# Patient Record
Sex: Female | Born: 1995 | Race: Black or African American | Hispanic: No | Marital: Single | Smoking: Former smoker
Health system: Southern US, Community
[De-identification: ages and names within clinical notes are randomized; demographics above are authoritative.]

## PROBLEM LIST (undated history)

## (undated) DIAGNOSIS — Z789 Other specified health status: Secondary | ICD-10-CM

## (undated) HISTORY — DX: Other specified health status: Z78.9

---
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
## 2019-03-28 ENCOUNTER — Encounter (HOSPITAL_COMMUNITY): Payer: Self-pay

## 2019-06-11 ENCOUNTER — Encounter: Payer: Self-pay | Admitting: *Deleted

## 2019-06-11 ENCOUNTER — Telehealth: Payer: Self-pay | Admitting: Obstetrics & Gynecology

## 2019-06-11 NOTE — Telephone Encounter (Signed)
The patient records were sent from [facility] network. Called the patient to confirm the upcoming appointment and received a fast busy signal. Mailing an appointment reminder letter.

## 2019-06-16 ENCOUNTER — Encounter: Payer: Self-pay | Admitting: *Deleted

## 2019-07-03 ENCOUNTER — Other Ambulatory Visit: Payer: Self-pay

## 2019-07-03 ENCOUNTER — Ambulatory Visit (INDEPENDENT_AMBULATORY_CARE_PROVIDER_SITE_OTHER): Payer: Medicaid Other | Admitting: *Deleted

## 2019-07-03 ENCOUNTER — Encounter: Payer: Self-pay | Admitting: *Deleted

## 2019-07-03 ENCOUNTER — Other Ambulatory Visit: Payer: Self-pay | Admitting: *Deleted

## 2019-07-03 DIAGNOSIS — O0930 Supervision of pregnancy with insufficient antenatal care, unspecified trimester: Secondary | ICD-10-CM | POA: Insufficient documentation

## 2019-07-03 DIAGNOSIS — Z3490 Encounter for supervision of normal pregnancy, unspecified, unspecified trimester: Secondary | ICD-10-CM

## 2019-07-03 MED ORDER — PRENATAL 27-1 MG PO TABS: 1.0000 | ORAL_TABLET | Freq: Every day | ORAL | 12 refills | Status: DC

## 2019-07-03 MED ORDER — COMPLETENATE 29-1 MG PO CHEW: 1.0000 | CHEWABLE_TABLET | Freq: Every day | ORAL | 12 refills | Status: AC

## 2019-07-03 MED ORDER — BLOOD PRESSURE KIT DEVI: 1.0000 | 0 refills | Status: DC | PRN

## 2019-07-03 NOTE — Progress Notes (Signed)
I connected with  Laurie Woodard on 07/03/19 at  9:30 AM EST by telephone and verified that I am speaking with the correct person using two identifiers.   I discussed the limitations, risks, security and privacy concerns of performing an evaluation and management service by telephone and the availability of in person appointments. I also discussed with the patient that there may be a patient responsible charge related to this service. The patient expressed understanding and agreed to proceed. Explained I am completing her New OB Intake today. We discussed Her EDD and that it is based on  sure LMP . I reviewed her allergies, meds, OB History, Medical /Surgical history, and appropriate screenings. I explained I will send her the [employer] app- app sent to her while on phone.  I explained we will send a blood pressure cuff to [facility] that will fill that prescription and they  will call her to verify her information. I asked her to bring the blood pressure cuff with her to her first ob appointment so we can show her how to use it. Explained  then we will have her take her blood pressure weekly and enter into the app. Explained she will have some visits in office and some virtually. She already has Community education officer. Reviewed appointment date/ time with her , our location and to wear mask, no visitors. Explained she will have exam, ob bloodwork, hemoglobin a1C, cbg , genetic testing if desired, pap if needed. I scheduled an [place] at 19 weeks and gave her the appointment. She voices understanding.  Qamar Aughenbaugh,RN 07/03/2019  9:35 AM

## 2019-07-03 NOTE — Patient Instructions (Signed)

## 2019-07-03 NOTE — Telephone Encounter (Signed)
Received fax from [facility] that PNV not covered by Medicaid. Will send in new RX.  Zlata Alcaide,RN

## 2019-07-07 ENCOUNTER — Encounter: Payer: Self-pay | Admitting: Obstetrics and Gynecology

## 2019-07-08 ENCOUNTER — Encounter: Payer: Self-pay | Admitting: Advanced Practice Midwife

## 2019-07-08 ENCOUNTER — Ambulatory Visit (INDEPENDENT_AMBULATORY_CARE_PROVIDER_SITE_OTHER): Payer: Medicaid Other | Admitting: Advanced Practice Midwife

## 2019-07-08 ENCOUNTER — Other Ambulatory Visit (HOSPITAL_COMMUNITY)
Admission: RE | Admit: 2019-07-08 | Discharge: 2019-07-08 | Disposition: A | Discharge status: Home / Self Care | Payer: BLUE CROSS/BLUE SHIELD | Source: Ambulatory Visit | Attending: Obstetrics and Gynecology | Admitting: Obstetrics and Gynecology

## 2019-07-08 ENCOUNTER — Encounter: Payer: Self-pay | Admitting: *Deleted

## 2019-07-08 ENCOUNTER — Other Ambulatory Visit: Payer: Self-pay

## 2019-07-08 VITALS — BP 112/72 | HR 125 | Temp 98.0°F | Wt 153.2 lb

## 2019-07-08 DIAGNOSIS — Z833 Family history of diabetes mellitus: Secondary | ICD-10-CM

## 2019-07-08 DIAGNOSIS — Z3A24 24 weeks gestation of pregnancy: Secondary | ICD-10-CM

## 2019-07-08 DIAGNOSIS — O0932 Supervision of pregnancy with insufficient antenatal care, second trimester: Secondary | ICD-10-CM

## 2019-07-08 DIAGNOSIS — Z1389 Encounter for screening for other disorder: Secondary | ICD-10-CM

## 2019-07-08 DIAGNOSIS — Z3490 Encounter for supervision of normal pregnancy, unspecified, unspecified trimester: Secondary | ICD-10-CM | POA: Insufficient documentation

## 2019-07-08 DIAGNOSIS — O0930 Supervision of pregnancy with insufficient antenatal care, unspecified trimester: Secondary | ICD-10-CM

## 2019-07-08 NOTE — Progress Notes (Signed)
Subjective:   Laurie Woodard is a 23 y.o. G2P0010 at 63w6dby early ultrasound being seen today for her first obstetrical visit.  Her obstetrical history is significant for none. Patient does intend to breast feed. Pregnancy history fully reviewed.  Patient reports no complaints.  HISTORY: OB History  Gravida Para Term Preterm AB Living  2 0 0 0 1 0  SAB TAB Ectopic Multiple Live Births  0 1 0 0 0    # Outcome Date GA Lbr Len/2nd Weight Sex Delivery Anes PTL Lv  2 Current           1 TAB 01/25/18            Last pap smear was done unsure, approx 2 years ago and was unsure   History reviewed. No pertinent past medical history. Past Surgical History:  Procedure Laterality Date  . FINGER SURGERY  2005   Family History  Problem Relation Age of Onset  . Hypertension Mother   . Depression Mother   . Kidney disease Mother   . Diabetes Father    Social History   Tobacco Use  . Smoking status: Former Smoker    Quit date: 11/01/2018    Years since quitting: 0.6  . Smokeless tobacco: Never Used  Substance Use Topics  . Alcohol use: Not Currently    Comment: occasional   . Drug use: Never   No Known Allergies Current Outpatient Medications on File Prior to Visit  Medication Sig Dispense Refill  . Blood Pressure Monitoring (BLOOD PRESSURE KIT) DEVI 1 Device by Does not apply route as needed. 1 each 0  . prenatal vitamin w/FE, FA (NATACHEW) 29-1 MG CHEW chewable tablet Chew 1 tablet by mouth daily at 12 noon. 30 tablet 12  . Prenatal 27-1 MG TABS Take 1 tablet by mouth daily. (Patient not taking: Reported on 07/08/2019) 30 tablet 12  . Prenatal Vit-Fe Fumarate-FA (PRENATAL VITAMINS PO) Take 1 tablet by mouth daily.     No current facility-administered medications on file prior to visit.    Review of Systems Pertinent items noted in HPI and remainder of comprehensive ROS otherwise negative.  Exam   Vitals:   07/08/19 0852  BP: 112/72  Pulse: (!) 125  Temp: 98 F (36.7  C)  Weight: 153 lb 3.2 oz (69.5 kg)   Fetal Heart Rate (bpm): 144 Physical Exam  Constitutional: She is oriented to person, place, and time and well-developed, well-nourished, and in no distress. No distress.  HENT:  Head: Normocephalic.  Cardiovascular: Normal rate.  Pulmonary/Chest: Effort normal.  Abdominal: Soft. There is no abdominal tenderness. There is no rebound.  Genitourinary:    Genitourinary Comments:  External: no lesion Vagina: small amount of white discharge Cervix: pink, smooth, no CMT Uterus: AGA, 24 weeks size uterus     Neurological: She is alert and oriented to person, place, and time.  Skin: Skin is warm and dry.  Psychiatric: Affect normal.  Nursing note and vitals reviewed.   Assessment:   Pregnancy: G2P0010 Patient Active Problem List   Diagnosis Date Noted  . Supervision of low-risk pregnancy 07/03/2019  . Late prenatal care affecting pregnancy 07/03/2019     Plan:  1. Encounter for supervision of low-risk pregnancy, antepartum - routine labs - Panorama/Horizon - Too late for quad/AFP - [place] - BP cuff given   2. Late prenatal care affecting pregnancy, antepartum    Initial labs drawn. Continue prenatal vitamins. Genetic Screening discussed, horizon and NIPS: ordered. Ultrasound  discussed; fetal anatomic survey: undecided. Problem list reviewed and updated. The nature of [place] with multiple MDs and other Advanced Practice Providers was explained to patient; also emphasized that residents, students are part of our team. Routine obstetric precautions reviewed. 50% of 45 min visit spent in counseling and coordination of care. Return in about 4 weeks (around 08/05/2019) for in person visit with GTT and 28 week labs.  Schedule Ob US next available appt.  Marcille Buffy DNP, CNM  07/08/19  10:15 AM

## 2019-07-09 LAB — HEMOGLOBIN A1C
Est. average glucose Bld gHb Est-mCnc: 91 mg/dL
Hgb A1c MFr Bld: 4.8 % (ref 4.8–5.6)

## 2019-07-09 LAB — OBSTETRIC PANEL, INCLUDING HIV
Antibody Screen: NEGATIVE
Basophils Absolute: 0.1 10*3/uL (ref 0.0–0.2)
Basos: 0 %
EOS (ABSOLUTE): 0.8 10*3/uL — ABNORMAL HIGH (ref 0.0–0.4)
Eos: 5 %
HIV Screen 4th Generation wRfx: NONREACTIVE
Hematocrit: 34.1 % (ref 34.0–46.6)
Hemoglobin: 11.7 g/dL (ref 11.1–15.9)
Hepatitis B Surface Ag: NEGATIVE
Immature Grans (Abs): 0.5 10*3/uL — ABNORMAL HIGH (ref 0.0–0.1)
Immature Granulocytes: 4 %
Lymphocytes Absolute: 1.7 10*3/uL (ref 0.7–3.1)
Lymphs: 11 %
MCH: 33 pg (ref 26.6–33.0)
MCHC: 34.3 g/dL (ref 31.5–35.7)
MCV: 96 fL (ref 79–97)
Monocytes Absolute: 1.4 10*3/uL — ABNORMAL HIGH (ref 0.1–0.9)
Monocytes: 10 %
Neutrophils Absolute: 10.4 10*3/uL — ABNORMAL HIGH (ref 1.4–7.0)
Neutrophils: 70 %
Platelets: 189 10*3/uL (ref 150–450)
RBC: 3.55 x10E6/uL — ABNORMAL LOW (ref 3.77–5.28)
RDW: 12.3 % (ref 11.7–15.4)
RPR Ser Ql: NONREACTIVE
Rh Factor: POSITIVE
Rubella Antibodies, IGG: 2.21 index (ref >0.99)
WBC: 14.8 10*3/uL — ABNORMAL HIGH (ref 3.4–10.8)

## 2019-07-09 LAB — GC/CHLAMYDIA PROBE AMP (~~LOC~~) NOT AT ARMC
Chlamydia: NEGATIVE
Comment: NEGATIVE
Comment: NORMAL
Neisseria Gonorrhea: NEGATIVE

## 2019-07-10 LAB — CYTOLOGY - PAP: Diagnosis: NEGATIVE

## 2019-07-10 LAB — URINE CULTURE, OB REFLEX

## 2019-07-10 LAB — CULTURE, OB URINE

## 2019-07-15 ENCOUNTER — Other Ambulatory Visit: Payer: Self-pay

## 2019-07-18 NOTE — L&D Delivery Note (Signed)
Laurie Woodard is a 24 y.o. female G2P0010 with IUP at [redacted]w[redacted]d admitted for SROM.  She progressed without augmentation to complete and pushed 2 times to deliver.  Cord clamping delayed by several minutes then clamped by CNM and cut by FOB.    Delivery Note At 12:28 AM a viable female was delivered in the water via Vaginal, Spontaneous (Presentation: Left Occiput Anterior).  APGAR: 9, 9; weight pending.   Placenta status: Spontaneous, Intact.  Cord: 3 vessels with the following complications: None.  Anesthesia: None Episiotomy: None Lacerations: 1st degree Suture Repair: 3.0 vicryl Est. Blood Loss (mL): 200  Mom to postpartum.  Baby to Couplet care / Skin to Skin.  Rolm Bookbinder CNM 10/13/2019, 12:57 AM

## 2019-07-29 ENCOUNTER — Other Ambulatory Visit: Payer: Self-pay

## 2019-07-29 ENCOUNTER — Ambulatory Visit (HOSPITAL_COMMUNITY)
Admission: RE | Admit: 2019-07-29 | Discharge: 2019-07-29 | Disposition: A | Discharge status: Home / Self Care | Payer: BLUE CROSS/BLUE SHIELD | Source: Ambulatory Visit | Attending: Obstetrics and Gynecology | Admitting: Obstetrics and Gynecology

## 2019-07-29 ENCOUNTER — Other Ambulatory Visit: Payer: Self-pay | Admitting: Advanced Practice Midwife

## 2019-07-29 ENCOUNTER — Other Ambulatory Visit (HOSPITAL_COMMUNITY): Payer: Self-pay | Admitting: *Deleted

## 2019-07-29 DIAGNOSIS — O0932 Supervision of pregnancy with insufficient antenatal care, second trimester: Secondary | ICD-10-CM

## 2019-07-29 DIAGNOSIS — Z3490 Encounter for supervision of normal pregnancy, unspecified, unspecified trimester: Secondary | ICD-10-CM | POA: Diagnosis not present

## 2019-07-29 DIAGNOSIS — Z362 Encounter for other antenatal screening follow-up: Secondary | ICD-10-CM

## 2019-07-29 DIAGNOSIS — Z363 Encounter for antenatal screening for malformations: Secondary | ICD-10-CM | POA: Diagnosis not present

## 2019-07-29 DIAGNOSIS — Z3A27 27 weeks gestation of pregnancy: Secondary | ICD-10-CM

## 2019-07-30 ENCOUNTER — Other Ambulatory Visit: Payer: Self-pay | Admitting: Lactation Services

## 2019-07-30 DIAGNOSIS — Z3490 Encounter for supervision of normal pregnancy, unspecified, unspecified trimester: Secondary | ICD-10-CM

## 2019-08-05 ENCOUNTER — Encounter: Payer: Self-pay | Admitting: Advanced Practice Midwife

## 2019-08-05 ENCOUNTER — Ambulatory Visit (INDEPENDENT_AMBULATORY_CARE_PROVIDER_SITE_OTHER): Payer: BLUE CROSS/BLUE SHIELD | Admitting: Advanced Practice Midwife

## 2019-08-05 ENCOUNTER — Other Ambulatory Visit: Payer: Self-pay

## 2019-08-05 ENCOUNTER — Other Ambulatory Visit: Payer: BLUE CROSS/BLUE SHIELD

## 2019-08-05 VITALS — BP 120/73 | HR 91 | Wt 157.0 lb

## 2019-08-05 DIAGNOSIS — Z3490 Encounter for supervision of normal pregnancy, unspecified, unspecified trimester: Secondary | ICD-10-CM

## 2019-08-05 DIAGNOSIS — Z23 Encounter for immunization: Secondary | ICD-10-CM | POA: Diagnosis not present

## 2019-08-05 DIAGNOSIS — Z3480 Encounter for supervision of other normal pregnancy, unspecified trimester: Secondary | ICD-10-CM

## 2019-08-05 DIAGNOSIS — Z3483 Encounter for supervision of other normal pregnancy, third trimester: Secondary | ICD-10-CM

## 2019-08-05 DIAGNOSIS — Z3A28 28 weeks gestation of pregnancy: Secondary | ICD-10-CM

## 2019-08-05 NOTE — Addendum Note (Signed)
Addended by: Ernestina Patches on: 08/05/2019 09:07 AM   Modules accepted: Orders

## 2019-08-05 NOTE — Progress Notes (Signed)
   PRENATAL VISIT NOTE  Subjective:  Laurie Woodard is a 24 y.o. G2P0010 at [redacted]w[redacted]d being seen today for ongoing prenatal care.  She is currently monitored for the following issues for this low-risk pregnancy and has Supervision of low-risk pregnancy and Late prenatal care affecting pregnancy on their problem list.  Patient reports no complaints.  Contractions: Not present. Vag. Bleeding: None.  Movement: Present. Denies leaking of fluid.   The following portions of the patient's history were reviewed and updated as appropriate: allergies, current medications, past family history, past medical history, past social history, past surgical history and problem list.   Objective:   Vitals:   08/05/19 0835  BP: 120/73  Pulse: 91  Weight: 157 lb (71.2 kg)    Fetal Status: Fetal Heart Rate (bpm): 142 Fundal Height: 28 cm Movement: Present     General:  Alert, oriented and cooperative. Patient is in no acute distress.  Skin: Skin is warm and dry. No rash noted.   Cardiovascular: Normal heart rate noted  Respiratory: Normal respiratory effort, no problems with respiration noted  Abdomen: Soft, gravid, appropriate for gestational age.  Pain/Pressure: Present     Pelvic: Cervical exam deferred        Extremities: Normal range of motion.  Edema: None  Mental Status: Normal mood and affect. Normal behavior. Normal judgment and thought content.   Assessment and Plan:  Pregnancy: G2P0010 at [redacted]w[redacted]d 1. Supervision of other normal pregnancy, antepartum - routine care - GTT and 28 week labs today   Preterm labor symptoms and general obstetric precautions including but not limited to vaginal bleeding, contractions, leaking of fluid and fetal movement were reviewed in detail with the patient. Please refer to After Visit Summary for other counseling recommendations.   Return in about 2 weeks (around 08/19/2019) for virtual visit .  Future Appointments  Date Time Provider Department Center  08/05/2019   8:50 AM WOC-WOCA LAB WOC-WOCA WOC  08/19/2019 10:30 AM WH-MFC NURSE WH-MFC MFC-US  08/19/2019 10:30 AM WH-MFC [place] 5 WH-MFCUS MFC-US    Thressa Sheller DNP, CNM  08/05/19  8:41 AM

## 2019-08-06 ENCOUNTER — Encounter: Payer: Self-pay | Admitting: Advanced Practice Midwife

## 2019-08-06 LAB — CBC
Hematocrit: 34.1 % (ref 34.0–46.6)
Hemoglobin: 11.9 g/dL (ref 11.1–15.9)
MCH: 33 pg (ref 26.6–33.0)
MCHC: 34.9 g/dL (ref 31.5–35.7)
MCV: 95 fL (ref 79–97)
Platelets: 152 10*3/uL (ref 150–450)
RBC: 3.61 x10E6/uL — ABNORMAL LOW (ref 3.77–5.28)
RDW: 11.7 % (ref 11.7–15.4)
WBC: 15.6 10*3/uL — ABNORMAL HIGH (ref 3.4–10.8)

## 2019-08-06 LAB — GLUCOSE TOLERANCE, 2 HOURS W/ 1HR
Glucose, 1 hour: 155 mg/dL (ref 65–179)
Glucose, 2 hour: 116 mg/dL (ref 65–152)
Glucose, Fasting: 68 mg/dL (ref 65–91)

## 2019-08-06 LAB — HIV ANTIBODY (ROUTINE TESTING W REFLEX): HIV Screen 4th Generation wRfx: NONREACTIVE

## 2019-08-06 LAB — RPR: RPR Ser Ql: NONREACTIVE

## 2019-08-19 ENCOUNTER — Other Ambulatory Visit: Payer: Self-pay

## 2019-08-19 ENCOUNTER — Encounter (HOSPITAL_COMMUNITY): Payer: Self-pay

## 2019-08-19 ENCOUNTER — Other Ambulatory Visit (HOSPITAL_COMMUNITY): Payer: Self-pay | Admitting: *Deleted

## 2019-08-19 ENCOUNTER — Ambulatory Visit (HOSPITAL_COMMUNITY): Payer: BLUE CROSS/BLUE SHIELD | Admitting: *Deleted

## 2019-08-19 ENCOUNTER — Telehealth (INDEPENDENT_AMBULATORY_CARE_PROVIDER_SITE_OTHER): Payer: BLUE CROSS/BLUE SHIELD

## 2019-08-19 ENCOUNTER — Ambulatory Visit (HOSPITAL_COMMUNITY)
Admission: RE | Admit: 2019-08-19 | Discharge: 2019-08-19 | Disposition: A | Discharge status: Home / Self Care | Payer: BLUE CROSS/BLUE SHIELD | Source: Ambulatory Visit | Attending: Obstetrics and Gynecology | Admitting: Obstetrics and Gynecology

## 2019-08-19 VITALS — BP 116/72 | HR 73 | Temp 97.7°F

## 2019-08-19 DIAGNOSIS — O0990 Supervision of high risk pregnancy, unspecified, unspecified trimester: Secondary | ICD-10-CM | POA: Insufficient documentation

## 2019-08-19 DIAGNOSIS — O0933 Supervision of pregnancy with insufficient antenatal care, third trimester: Secondary | ICD-10-CM

## 2019-08-19 DIAGNOSIS — Z3A30 30 weeks gestation of pregnancy: Secondary | ICD-10-CM

## 2019-08-19 DIAGNOSIS — O365930 Maternal care for other known or suspected poor fetal growth, third trimester, not applicable or unspecified: Secondary | ICD-10-CM

## 2019-08-19 DIAGNOSIS — Z3490 Encounter for supervision of normal pregnancy, unspecified, unspecified trimester: Secondary | ICD-10-CM

## 2019-08-19 DIAGNOSIS — Z362 Encounter for other antenatal screening follow-up: Secondary | ICD-10-CM | POA: Diagnosis not present

## 2019-08-19 DIAGNOSIS — Z3689 Encounter for other specified antenatal screening: Secondary | ICD-10-CM

## 2019-08-19 NOTE — Patient Instructions (Signed)
Braxton Hicks Contractions °Contractions of the uterus can occur throughout pregnancy, but they are not always a sign that you are in labor. You may have practice contractions called Braxton Hicks contractions. These false labor contractions are sometimes confused with true labor. °What are Braxton Hicks contractions? °Braxton Hicks contractions are tightening movements that occur in the muscles of the uterus before labor. Unlike true labor contractions, these contractions do not result in opening (dilation) and thinning of the cervix. Toward the end of pregnancy (32-34 weeks), Braxton Hicks contractions can happen more often and may become stronger. These contractions are sometimes difficult to tell apart from true labor because they can be very uncomfortable. You should not feel embarrassed if you go to the hospital with false labor. °Sometimes, the only way to tell if you are in true labor is for your health care provider to look for changes in the cervix. The health care provider will do a physical exam and may monitor your contractions. If you are not in true labor, the exam should show that your cervix is not dilating and your water has not broken. °If there are no other health problems associated with your pregnancy, it is completely safe for you to be sent home with false labor. You may continue to have Braxton Hicks contractions until you go into true labor. °How to tell the difference between true labor and false labor °True labor °· Contractions last 30-70 seconds. °· Contractions become very regular. °· Discomfort is usually felt in the top of the uterus, and it spreads to the lower abdomen and low back. °· Contractions do not go away with walking. °· Contractions usually become more intense and increase in frequency. °· The cervix dilates and gets thinner. °False labor °· Contractions are usually shorter and not as strong as true labor contractions. °· Contractions are usually irregular. °· Contractions  are often felt in the front of the lower abdomen and in the groin. °· Contractions may go away when you walk around or change positions while lying down. °· Contractions get weaker and are shorter-lasting as time goes on. °· The cervix usually does not dilate or become thin. °Follow these instructions at home: ° °· Take over-the-counter and prescription medicines only as told by your health care provider. °· Keep up with your usual exercises and follow other instructions from your health care provider. °· Eat and drink lightly if you think you are going into labor. °· If Braxton Hicks contractions are making you uncomfortable: °? Change your position from lying down or resting to walking, or change from walking to resting. °? Sit and rest in a tub of warm water. °? Drink enough fluid to keep your urine pale yellow. Dehydration may cause these contractions. °? Do slow and deep breathing several times an hour. °· Keep all follow-up prenatal visits as told by your health care provider. This is important. °Contact a health care provider if: °· You have a fever. °· You have continuous pain in your abdomen. °Get help right away if: °· Your contractions become stronger, more regular, and closer together. °· You have fluid leaking or gushing from your vagina. °· You pass blood-tinged mucus (bloody show). °· You have bleeding from your vagina. °· You have low back pain that you never had before. °· You feel your baby’s head pushing down and causing pelvic pressure. °· Your baby is not moving inside you as much as it used to. °Summary °· Contractions that occur before labor are   called Braxton Hicks contractions, false labor, or practice contractions. °· Braxton Hicks contractions are usually shorter, weaker, farther apart, and less regular than true labor contractions. True labor contractions usually become progressively stronger and regular, and they become more frequent. °· Manage discomfort from Braxton Hicks contractions  by changing position, resting in a warm bath, drinking plenty of water, or practicing deep breathing. °This information is not intended to replace advice given to you by your health care provider. Make sure you discuss any questions you have with your health care provider. °Document Revised: 06/15/2017 Document Reviewed: 11/16/2016 °Elsevier Patient Education © 2020 [employer] ° °

## 2019-08-19 NOTE — Progress Notes (Signed)
TELEHEALTH OBSTETRICS PRENATAL VIRTUAL VIDEO VISIT ENCOUNTER NOTE  Provider location: Center for [facility] [place]   I connected with Laurie Woodard on 08/19/19 at  9:15 AM EST by MyChart Video Encounter at home and verified that I am speaking with the correct person using two identifiers.   I discussed the limitations, risks, security and privacy concerns of performing an evaluation and management service virtually and the availability of in person appointments. I also discussed with the patient that there may be a patient responsible charge related to this service. The patient expressed understanding and agreed to proceed. Subjective:  Laurie Woodard is a 24 y.o. G2P0010 at [redacted]w[redacted]d being seen today for ongoing prenatal care.  She is currently monitored for the following issues for this low-risk pregnancy and has Supervision of low-risk pregnancy and Late prenatal care affecting pregnancy on their problem list.  Patient reports BH contractions.  Contractions: Not present. Vag. Bleeding: None.  Movement: Present. Denies any leaking of fluid.   Patient questions how she can go about obtaining a new doula who can do delivers at [facility].   The following portions of the patient's history were reviewed and updated as appropriate: allergies, current medications, past family history, past medical history, past social history, past surgical history and problem list.   Objective:   Vitals:   08/19/19 0918  BP: 111/64  Pulse: 76    Fetal Status:     Movement: Present     General:  Alert, oriented and cooperative. Patient is in no acute distress.  Respiratory: Normal respiratory effort, no problems with respiration noted  Mental Status: Normal mood and affect. Normal behavior. Normal judgment and thought content.  Rest of physical exam deferred due to type of encounter  Imaging: [place] MFM OB COMP + 14 WK  Result Date:  07/29/2019 ----------------------------------------------------------------------  OBSTETRICS REPORT                       (Signed Final 07/29/2019 12:40 pm) ---------------------------------------------------------------------- Patient Info  ID #:       [id number]                          D.O.B.:  04/17/1996 (23 yrs)  Name:       Laurie Woodard                    Visit Date: 07/29/2019 09:20 am ---------------------------------------------------------------------- Performed By  Performed By:     Tommie Raymond BS,       Ref. Address:     [address]                    [place], [place]                                                             [address]  Attending:        Noralee Space MD        [facility]  [facility]      [facility] ---------------------------------------------------------------------- Orders   #  Description                          Code         Ordered By   1  US MFM OB COMP + 14 WK               76805.01     HEATHER HOGAN  ----------------------------------------------------------------------   #  Order #                    Accession #                 Episode #   1  [id number]                  [id number]                  [id number]  ---------------------------------------------------------------------- Indications   [redacted] weeks gestation of pregnancy                Z3A.27   Late prenatal care, second trimester           O09.32   Encounter for antenatal screening for          Z36.3   malformations (Low Risk NIPS)  ---------------------------------------------------------------------- Fetal Evaluation  Num Of Fetuses:         1  Fetal Heart Rate(bpm):  138  Cardiac Activity:       Observed  Presentation:           Cephalic  Placenta:               Anterior  P. Cord Insertion:      Visualized, central  Amniotic Fluid  AFI FV:      Within normal limits                              Largest Pocket(cm)                               5.73 ---------------------------------------------------------------------- Biometry  BPD:      68.3  mm     G. Age:  27w 3d         26  %    CI:        79.68   %    70 - 86                                                          FL/HC:      20.1   %    18.8 - 20.6  HC:      241.8  mm     G. Age:  26w 2d          1  %    HC/AC:      1.06        1.05 - 1.21  AC:      228.6  mm     G. Age:  27w 2d         24  %  FL/BPD:     71.2   %    71 - 87  FL:       48.6  mm     G. Age:  26w 2d          5  %    FL/AC:      21.3   %    20 - 24  HUM:      44.5  mm     G. Age:  26w 3d         14  %  CER:      35.8  mm     G. Age:  30w 6d       > 95  %  LV:        3.5  mm  CM:        8.1  mm  Est. FW:     990  gm      2 lb 3 oz     10  % ---------------------------------------------------------------------- OB History  Blood Type:    O+  Gravidity:    2         Term:   0        Prem:   0        SAB:   0  TOP:          1       Ectopic:  0        Living: 0 ---------------------------------------------------------------------- Gestational Age  LMP:           25w 6d        Date:  01/29/19                 EDD:   11/05/19  U/S Today:     26w 6d                                        EDD:   10/29/19  Best:          27w 6d     Det. By:  Marcella DubsEarly Ultrasound         EDD:   10/22/19                                      (03/28/19) ---------------------------------------------------------------------- Anatomy  Cranium:               Appears normal         LVOT:                   Appears normal  Cavum:                 Appears normal         Aortic Arch:            Appears normal  Ventricles:            Appears normal         Ductal Arch:            Appears normal  Choroid Plexus:        Appears normal         Diaphragm:              Appears normal  Cerebellum:  Appears normal         Stomach:                Appears normal, left                                                                        sided  Posterior Fossa:        Appears normal         Abdomen:                Appears normal  Nuchal Fold:           Not applicable (>20    Abdominal Wall:         Appears nml (cord                         wks GA)                                        insert, abd wall)  Face:                  Appears normal         Cord Vessels:           Appears normal (3                         (orbits and profile)                           vessel cord)  Lips:                  Appears normal         Kidneys:                Appear normal  Palate:                Not well visualized    Bladder:                Appears normal  Thoracic:              Appears normal         Spine:                  Limited views                                                                        appear normal  Heart:                 Appears normal         Upper Extremities:      Appears normal                         (  4CH, axis, and                         situs)  RVOT:                  Appears normal         Lower Extremities:      Appears normal  Other:  Parents do not wish to know sex of fetus at this time- Female gender.          Heels and Left 5th digit visualized. Nasal bone visualized. ---------------------------------------------------------------------- Doppler - Fetal Vessels  Umbilical Artery   S/D     %tile     RI              PI                     ADFV    RDFV  2.95       45   0.66             1.02                        No      No ---------------------------------------------------------------------- Cervix Uterus Adnexa  Cervix  Not visualized (advanced GA >24wks)  Uterus  No abnormality visualized.  Left Ovary  Within normal limits. No adnexal mass visualized.  Right Ovary  Within normal limits. No adnexal mass visualized.  Cul De Sac  No free fluid seen.  Adnexa  No abnormality visualized. ---------------------------------------------------------------------- Impression  Late prenatal care.  Her pregnancy is well dated by 10-week  ultrasound.  She does not have any  chronic medical  conditions including diabetes or hypertension.  On cell-free fetal DNA screening, the risks of fetal  aneuploidies are not increased.  We performed fetal anatomy scan. No makers of  aneuploidies or fetal structural defects are seen. Fetal  biometry is consistent with her previously-established dates.  The estimated fetal weight is at the 10th  percentile.  Abdominal circumference measurement is at the 24th  percentile.  Amniotic fluid is normal and good fetal activity is  seen.  Umbilical artery Doppler showed normal forward  diastolic flow.  Patient understands the limitations of  ultrasound in detecting fetal anomalies.  I reassured the patient of the findings that I did not suspect  fetal growth restriction now. ---------------------------------------------------------------------- Recommendations  -An appointment was made for her to return in 3 weeks for  fetal growth assessment. ----------------------------------------------------------------------                  Tama High, MD Electronically Signed Final Report   07/29/2019 12:40 pm ----------------------------------------------------------------------   Assessment and Plan:  Pregnancy: G2P0010 at [redacted]w[redacted]d 1. Encounter for supervision of low-risk pregnancy, antepartum -Reviewed BH contractions and normalcy during 3rd trimester.  Educated on difference between [facility] contractions and signs of PTL. -Discussed usage of Doula services during labor.  Informed that doula supervisor would be contacted regarding further information. -Reviewed previous ultrasound and patient scheduled for f/u today. -Anticipatory guidance for upcoming appts.  Preterm labor symptoms and general obstetric precautions including but not limited to vaginal bleeding, contractions, leaking of fluid and fetal movement were reviewed in detail with the patient. I discussed the assessment and treatment plan with the patient. The patient was provided an opportunity to ask  questions and all were answered. The patient agreed with the plan and demonstrated an understanding of the  instructions. The patient was advised to call back or seek an in-person office evaluation/go to MAU at [facility] for any urgent or concerning symptoms. Please refer to After Visit Summary for other counseling recommendations.   I provided 7 minutes of face-to-face time during this encounter.  No follow-ups on file.  Future Appointments  Date Time Provider Department Center  08/19/2019 10:30 AM WH-MFC NURSE WH-MFC MFC-US  08/19/2019 10:30 AM WH-MFC [place] 5 WH-MFCUS MFC-US  09/02/2019  9:35 AM Sharyon Cable, CNM WOC-WOCA WOC  09/16/2019  9:35 AM Magnus Sinning [place], PA-C WOC-WOCA WOC  09/23/2019  9:35 AM Marvetta Gibbons, Brand Males, NP WOC-WOCA WOC  09/30/2019  9:35 AM Nugent, Odie Sera, NP WOC-WOCA WOC  10/07/2019  9:35 AM Armando Reichert, CNM WOC-WOCA WOC    Cherre Robins, CNM Center for [employer], [facility]

## 2019-08-19 NOTE — Progress Notes (Signed)
Called pt at 0901. VM left stating I was calling to check pt in for virtual appt and will call again at 0915.  I connected with  [name] on 08/19/19 at 0917 by MyChart and verified that I am speaking with the correct person using two identifiers.   I discussed the limitations, risks, security and privacy concerns of performing an evaluation and management service by telephone and the availability of in person appointments. I also discussed with the patient that there may be a patient responsible charge related to this service. The patient expressed understanding and agreed to proceed.  Marjo Bicker, RN 08/19/2019  9:17 AM

## 2019-09-02 ENCOUNTER — Telehealth (INDEPENDENT_AMBULATORY_CARE_PROVIDER_SITE_OTHER): Payer: BC Managed Care – PPO | Admitting: Certified Nurse Midwife

## 2019-09-02 ENCOUNTER — Encounter: Payer: Self-pay | Admitting: Certified Nurse Midwife

## 2019-09-02 DIAGNOSIS — Z3A32 32 weeks gestation of pregnancy: Secondary | ICD-10-CM

## 2019-09-02 DIAGNOSIS — Z3493 Encounter for supervision of normal pregnancy, unspecified, third trimester: Secondary | ICD-10-CM

## 2019-09-02 DIAGNOSIS — O0933 Supervision of pregnancy with insufficient antenatal care, third trimester: Secondary | ICD-10-CM

## 2019-09-02 NOTE — Progress Notes (Signed)
TELEHEALTH OBSTETRICS PRENATAL VIRTUAL VIDEO VISIT ENCOUNTER NOTE  Provider location: Center for [facility] [place]   I connected with Laurie Woodard on 09/02/19 at  9:35 AM EST by MyChart Video Encounter at home and verified that I am speaking with the correct person using two identifiers.   I discussed the limitations, risks, security and privacy concerns of performing an evaluation and management service virtually and the availability of in person appointments. I also discussed with the patient that there may be a patient responsible charge related to this service. The patient expressed understanding and agreed to proceed. Subjective:  Laurie Woodard is a 24 y.o. G2P0010 at [redacted]w[redacted]d being seen today for ongoing prenatal care.  She is currently monitored for the following issues for this low-risk pregnancy and has Supervision of low-risk pregnancy and Late prenatal care affecting pregnancy on their problem list.  Patient reports no complaints.  Contractions: Not present. Vag. Bleeding: None.  Movement: Present. Denies any leaking of fluid.   The following portions of the patient's history were reviewed and updated as appropriate: allergies, current medications, past family history, past medical history, past social history, past surgical history and problem list.   Objective:  There were no vitals filed for this visit.  Fetal Status:     Movement: Present     General:  Alert, oriented and cooperative. Patient is in no acute distress.  Respiratory: Normal respiratory effort, no problems with respiration noted  Mental Status: Normal mood and affect. Normal behavior. Normal judgment and thought content.  Rest of physical exam deferred due to type of encounter  Imaging: [place] MFM OB FOLLOW UP  Result Date: 08/19/2019 ----------------------------------------------------------------------  OBSTETRICS REPORT                       (Signed Final 08/19/2019 12:13 pm)  ---------------------------------------------------------------------- Patient Info  ID #:       [id number]                          D.O.B.:  1995/11/12 (24 yrs)  Name:       Laurie Woodard                    Visit Date: 08/19/2019 10:15 am ---------------------------------------------------------------------- Performed By  Performed By:     Hurman Horn          Ref. Address:     [address]                    [place]                                                             [address]  Attending:        Ma Rings MD         [facility]  [facility] By:      [facility] ---------------------------------------------------------------------- Orders   #  Description                          Code         Ordered By   1  US MFM OB FOLLOW UP                  [id number]     Noralee SpaceAVI SHANKAR  ----------------------------------------------------------------------   #  Order #                    Accession #                 Episode #   1  [id number]                  [id number]  ---------------------------------------------------------------------- Indications   Maternal care for known or suspected poor      O36.5930   fetal growth, third trimester, not applicable or   unspecified IUGR   [redacted] weeks gestation of pregnancy                Z3A.30   Late to prenatal care, third trimester         O09.33   Encounter for other antenatal screening        Z36.2   follow-up (low risk Nips, neg horizon)  ---------------------------------------------------------------------- Fetal Evaluation  Num Of Fetuses:         1  Fetal Heart Rate(bpm):  126  Cardiac Activity:       Observed  Presentation:           Cephalic  Placenta:               Anterior  P. Cord Insertion:      Visualized  Amniotic Fluid  AFI FV:      Within normal limits  AFI Sum(cm)     %Tile       Largest Pocket(cm)  14.99           53          4.54  RUQ(cm)       RLQ(cm)        LUQ(cm)        LLQ(cm)  4.54          4.52          3.43           2.5 ---------------------------------------------------------------------- Biometry  BPD:      74.2  mm     G. Age:  29w 5d         12  %    CI:         73.5   %    70 - 86                                                          FL/HC:      19.9   %    19.3 - 21.3  HC:       275   mm     G. Age:  30w 0d          5  %    HC/AC:      1.05        0.96 - 1.17  AC:      261.8  mm     G. Age:  30w 2d         30  %    FL/BPD:     73.6   %    71 - 87  FL:       54.6  mm     G. Age:  28w 6d          3  %    FL/AC:      20.9   %    20 - 24  HUM:      50.4  mm     G. Age:  29w 4d         24  %  Est. FW:    1453  gm      3 lb 3 oz     11  % ---------------------------------------------------------------------- OB History  Blood Type:    O+  Gravidity:    2         Term:   0        Prem:   0        SAB:   0  TOP:          1       Ectopic:  0        Living: 0 ---------------------------------------------------------------------- Gestational Age  LMP:           28w 6d        Date:  01/29/19                 EDD:   11/05/19  U/S Today:     29w 5d                                        EDD:   10/30/19  Best:          30w 6d     Det. By:  Marcella Dubs         EDD:   10/22/19                                      (03/28/19) ---------------------------------------------------------------------- Anatomy  Cranium:               Appears normal         LVOT:                   Appears normal  Cavum:                 Appears normal         Aortic Arch:            Appears normal  Ventricles:            Appears normal         Ductal Arch:            Appears normal  Choroid Plexus:        Previously seen        Diaphragm:  Appears normal  Cerebellum:            Previously seen        Stomach:                Appears normal, left                                                                        sided  Posterior Fossa:       Previously seen        Abdomen:                 Appears normal  Nuchal Fold:           Not applicable (>78    Abdominal Wall:         Previously seen                         wks GA)  Face:                  Orbits and profile     Cord Vessels:           Appears normal (3                         previously seen                                vessel cord)  Lips:                  Appears normal         Kidneys:                Appear normal  Palate:                Not well visualized    Bladder:                Appears normal  Thoracic:              Appears normal         Spine:                  Limited views                                                                        previously seen  Heart:                 Appears normal         Upper Extremities:      Previously seen                         (4CH, axis, and  situs)  RVOT:                  Appears normal         Lower Extremities:      Previously seen  Other:  Parents do not wish to know sex of fetus at this time- Female gender.          Heels and Left 5th digit prev visualized. Nasal bone visualized. ---------------------------------------------------------------------- Comments  This patient was seen for a follow up growth scan as the  overall fetal growth measured in the lower normal range  during her prior ultrasound exam.  The patient denies any  problems since her last exam and reports feeling fetal  movements throughout the day.  The overall EFW obtained today continues to measure in the  lower normal range (11th percentile).  There was normal  amniotic fluid noted.  Doppler studies of the umbilical arteries  performed today continues to show normal forward flow.  There were no signs of absent or reversed end-diastolic flow.  The patient was advised that we will continue to follow her  closely to assess the fetal growth.  A follow-up growth scan  was scheduled in 3 weeks.  She understands that should fetal growth restriction be noted  later in her pregnancy, that an  earlier delivery usually at  around 37 to 38 weeks will be recommended. ----------------------------------------------------------------------                   Ma Rings, MD Electronically Signed Final Report   08/19/2019 12:13 pm ----------------------------------------------------------------------   Assessment and Plan:  Pregnancy: G2P0010 at [redacted]w[redacted]d 1. Encounter for supervision of low-risk pregnancy in third trimester - Patient doing well, no complaints - Routine prenatal care  - Anticipatory guidance on upcoming appointments with in person visit being scheduled for 36 weeks for GBS swabs  - Educated and discussed birth plan/goals with patient, patient is planning on having doula- discussed with patient that during this time all doulas must go through screening process prior to arrival. Gave patient website for doula to fill out form ([email or web address]  - Patient is interested in waterbirth, discussed with patient that waterbirth at this time is scheduled to return the beginning of March. Discussed with patient the requirement of completion of waterbirth class and negative COVID test prior to being able to have waterbirth - patient verbalizes understanding.  - Follow up fetal growth [place] scheduled for 2/23, patient currently measuring in 11%tile for EFW   2. Late prenatal care affecting pregnancy in third trimester - Care initiated at 24 weeks    Preterm labor symptoms and general obstetric precautions including but not limited to vaginal bleeding, contractions, leaking of fluid and fetal movement were reviewed in detail with the patient. I discussed the assessment and treatment plan with the patient. The patient was provided an opportunity to ask questions and all were answered. The patient agreed with the plan and demonstrated an understanding of the instructions. The patient was advised to call back or seek an in-person office evaluation/go to MAU at [facility] for  any urgent or concerning symptoms. Please refer to After Visit Summary for other counseling recommendations.   I provided 10 minutes of face-to-face time during this encounter.  Return in about 3 weeks (around 09/23/2019) for ROB/GBS.  Future Appointments  Date Time Provider Department Center  09/09/2019 10:30 AM WH-MFC NURSE WH-MFC MFC-US  09/09/2019 10:30 AM WH-MFC [place] 1 WH-MFCUS MFC-US  09/16/2019  9:35  AM Kathlene Cote [facility] WOC  09/23/2019  9:35 AM Marvetta Gibbons, Brand Males, NP WOC-WOCA WOC  09/30/2019  9:35 AM Loralee Pacas, Odie Sera, NP WOC-WOCA WOC  10/07/2019  9:35 AM Armando Reichert, CNM WOC-WOCA WOC    Sharyon Cable, CNM Center for [employer], [facility]

## 2019-09-02 NOTE — Progress Notes (Signed)
I connected with  Laurie Woodard on 09/02/19 at  9:35 AM EST by telephone and verified that I am speaking with the correct person using two identifiers.   I discussed the limitations, risks, security and privacy concerns of performing an evaluation and management service by telephone and the availability of in person appointments. I also discussed with the patient that there may be a patient responsible charge related to this service. The patient expressed understanding and agreed to proceed.  Henrietta Dine, CMA 09/02/2019  9:28 AM

## 2019-09-04 ENCOUNTER — Encounter: Payer: Self-pay | Admitting: Advanced Practice Midwife

## 2019-09-09 ENCOUNTER — Ambulatory Visit (HOSPITAL_COMMUNITY): Payer: BC Managed Care – PPO | Admitting: *Deleted

## 2019-09-09 ENCOUNTER — Ambulatory Visit (HOSPITAL_COMMUNITY)
Admission: RE | Admit: 2019-09-09 | Discharge: 2019-09-09 | Disposition: A | Discharge status: Home / Self Care | Payer: BC Managed Care – PPO | Source: Ambulatory Visit | Attending: Obstetrics | Admitting: Obstetrics

## 2019-09-09 ENCOUNTER — Other Ambulatory Visit: Payer: Self-pay

## 2019-09-09 ENCOUNTER — Other Ambulatory Visit (HOSPITAL_COMMUNITY): Payer: Self-pay | Admitting: *Deleted

## 2019-09-09 ENCOUNTER — Encounter (HOSPITAL_COMMUNITY): Payer: Self-pay

## 2019-09-09 DIAGNOSIS — O0933 Supervision of pregnancy with insufficient antenatal care, third trimester: Secondary | ICD-10-CM | POA: Insufficient documentation

## 2019-09-09 DIAGNOSIS — Z362 Encounter for other antenatal screening follow-up: Secondary | ICD-10-CM

## 2019-09-09 DIAGNOSIS — Z3A33 33 weeks gestation of pregnancy: Secondary | ICD-10-CM | POA: Diagnosis not present

## 2019-09-09 DIAGNOSIS — Z3689 Encounter for other specified antenatal screening: Secondary | ICD-10-CM | POA: Diagnosis not present

## 2019-09-09 DIAGNOSIS — IMO0002 Reserved for concepts with insufficient information to code with codable children: Secondary | ICD-10-CM

## 2019-09-16 ENCOUNTER — Encounter: Payer: Self-pay | Admitting: Medical

## 2019-09-16 ENCOUNTER — Other Ambulatory Visit: Payer: Self-pay

## 2019-09-16 ENCOUNTER — Ambulatory Visit (INDEPENDENT_AMBULATORY_CARE_PROVIDER_SITE_OTHER): Payer: BC Managed Care – PPO | Admitting: Medical

## 2019-09-16 VITALS — BP 123/68 | HR 85 | Wt 166.4 lb

## 2019-09-16 DIAGNOSIS — O0933 Supervision of pregnancy with insufficient antenatal care, third trimester: Secondary | ICD-10-CM

## 2019-09-16 DIAGNOSIS — Z3A34 34 weeks gestation of pregnancy: Secondary | ICD-10-CM

## 2019-09-16 DIAGNOSIS — Z3493 Encounter for supervision of normal pregnancy, unspecified, third trimester: Secondary | ICD-10-CM

## 2019-09-16 NOTE — Patient Instructions (Addendum)
Fetal Movement Counts Patient Name: ________________________________________________ Patient Due Date: ____________________ What is a fetal movement count?  A fetal movement count is the number of times that you feel your baby move during a certain amount of time. This may also be called a fetal kick count. A fetal movement count is recommended for every pregnant woman. You may be asked to start counting fetal movements as early as week 28 of your pregnancy. Pay attention to when your baby is most active. You may notice your baby's sleep and wake cycles. You may also notice things that make your baby move more. You should do a fetal movement count:  When your baby is normally most active.  At the same time each day. A good time to count movements is while you are resting, after having something to eat and drink. How do I count fetal movements? 1. Find a quiet, comfortable area. Sit, or lie down on your side. 2. Write down the date, the start time and stop time, and the number of movements that you felt between those two times. Take this information with you to your health care visits. 3. Write down your start time when you feel the first movement. 4. Count kicks, flutters, swishes, rolls, and jabs. You should feel at least 10 movements. 5. You may stop counting after you have felt 10 movements, or if you have been counting for 2 hours. Write down the stop time. 6. If you do not feel 10 movements in 2 hours, contact your health care provider for further instructions. Your health care provider may want to do additional tests to assess your baby's well-being. Contact a health care provider if:  You feel fewer than 10 movements in 2 hours.  Your baby is not moving like he or she usually does. Date: ____________ Start time: ____________ Stop time: ____________ Movements: ____________ Date: ____________ Start time: ____________ Stop time: ____________ Movements: ____________ Date: ____________  Start time: ____________ Stop time: ____________ Movements: ____________ Date: ____________ Start time: ____________ Stop time: ____________ Movements: ____________ Date: ____________ Start time: ____________ Stop time: ____________ Movements: ____________ Date: ____________ Start time: ____________ Stop time: ____________ Movements: ____________ Date: ____________ Start time: ____________ Stop time: ____________ Movements: ____________ Date: ____________ Start time: ____________ Stop time: ____________ Movements: ____________ Date: ____________ Start time: ____________ Stop time: ____________ Movements: ____________ This information is not intended to replace advice given to you by your health care provider. Make sure you discuss any questions you have with your health care provider. Document Revised: 02/20/2019 Document Reviewed: 02/20/2019 Elsevier Patient Education  2020 Elsevier Inc. Braxton Hicks Contractions Contractions of the uterus can occur throughout pregnancy, but they are not always a sign that you are in labor. You may have practice contractions called Braxton Hicks contractions. These false labor contractions are sometimes confused with true labor. What are Braxton Hicks contractions? Braxton Hicks contractions are tightening movements that occur in the muscles of the uterus before labor. Unlike true labor contractions, these contractions do not result in opening (dilation) and thinning of the cervix. Toward the end of pregnancy (32-34 weeks), Braxton Hicks contractions can happen more often and may become stronger. These contractions are sometimes difficult to tell apart from true labor because they can be very uncomfortable. You should not feel embarrassed if you go to the hospital with false labor. Sometimes, the only way to tell if you are in true labor is for your health care provider to look for changes in the cervix. The health care provider   will do a physical exam and may  monitor your contractions. If you are not in true labor, the exam should show that your cervix is not dilating and your water has not broken. °If there are no other health problems associated with your pregnancy, it is completely safe for you to be sent home with false labor. You may continue to have Braxton Hicks contractions until you go into true labor. °How to tell the difference between true labor and false labor °True labor °· Contractions last 30-70 seconds. °· Contractions become very regular. °· Discomfort is usually felt in the top of the uterus, and it spreads to the lower abdomen and low back. °· Contractions do not go away with walking. °· Contractions usually become more intense and increase in frequency. °· The cervix dilates and gets thinner. °False labor °· Contractions are usually shorter and not as strong as true labor contractions. °· Contractions are usually irregular. °· Contractions are often felt in the front of the lower abdomen and in the groin. °· Contractions may go away when you walk around or change positions while lying down. °· Contractions get weaker and are shorter-lasting as time goes on. °· The cervix usually does not dilate or become thin. °Follow these instructions at home: ° °· Take over-the-counter and prescription medicines only as told by your health care provider. °· Keep up with your usual exercises and follow other instructions from your health care provider. °· Eat and drink lightly if you think you are going into labor. °· If Braxton Hicks contractions are making you uncomfortable: °? Change your position from lying down or resting to walking, or change from walking to resting. °? Sit and rest in a tub of warm water. °? Drink enough fluid to keep your urine pale yellow. Dehydration may cause these contractions. °? Do slow and deep breathing several times an hour. °· Keep all follow-up prenatal visits as told by your health care provider. This is important. °Contact a  health care provider if: °· You have a fever. °· You have continuous pain in your abdomen. °Get help right away if: °· Your contractions become stronger, more regular, and closer together. °· You have fluid leaking or gushing from your vagina. °· You pass blood-tinged mucus (bloody show). °· You have bleeding from your vagina. °· You have low back pain that you never had before. °· You feel your baby’s head pushing down and causing pelvic pressure. °· Your baby is not moving inside you as much as it used to. °Summary °· Contractions that occur before labor are called Braxton Hicks contractions, false labor, or practice contractions. °· Braxton Hicks contractions are usually shorter, weaker, farther apart, and less regular than true labor contractions. True labor contractions usually become progressively stronger and regular, and they become more frequent. °· Manage discomfort from Braxton Hicks contractions by changing position, resting in a warm bath, drinking plenty of water, or practicing deep breathing. °This information is not intended to replace advice given to you by your health care provider. Make sure you discuss any questions you have with your health care provider. °Document Revised: 06/15/2017 Document Reviewed: 11/16/2016 °Elsevier Patient Education © 2020 Elsevier Inc. ° °AREA PEDIATRIC/FAMILY PRACTICE PHYSICIANS ° °[place] ([address]) °• [facility] °o Chambliss, MD; Eniola, MD; Hale, MD; Hensel, MD; McDiarmid, MD; McIntyer, MD; Neal, MD; Walden, MD °o [address] [place], [place] [address] °o [phone] °o Mon-Fri 8:30-12:30, 1:30-5:00 °o Providers come to see babies   at [facility] °o Accepting Medicaid °• [facility] °o Limited providers who accept newborns: Koirala, MD; Morrow, MD; Wolters, MD °o [address], [place], [place] [address] °o [phone] °o Mon-Fri 8:00-5:30 °o Babies seen by providers at  [facility] °o Does NOT accept Medicaid °o Please call early in hospitalization for appointment (limited availability)  °• [facility] °o Mulberry, MD °o [address] [place], [place] [address] °o [phone] °o Mon, Tue, Thur, Fri 8:30-5:00, Wed 10:00-7:00 (closed 1-2pm) °o Babies seen by [facility] providers °o Accepting Medicaid °• [facility] Pediatric[facility] °o Rubin, MD °o [address], [place], [place] [address] °o [phone] °o Mon-Fri 8:30-5:00, Sat 8:30-12:00 °o Provider comes to see babies at [facility] °o Accepting Medicaid °o Must have been referred from current patients or contacted office prior to delivery °• [facility] ([facility]) °o Brown, MD; Chandler, MD; Ettefagh, MD; Grant, MD; Lester, MD; McCormick, MD; McQueen, MD; Prose, MD; Simha, MD; Stanley, MD; Stryffeler, NP; Tebben, NP °o [address], [place], [place] [address] °o [phone] °o Mon, Tue, Thur, Fri 8:30-5:30, Wed 9:30-5:30, Sat 8:30-12:30 °o Babies seen by [facility] providers °o Accepting Medicaid °o Only accepting infants of first-time parents or siblings of current patients °o Hospital discharge coordinator will make follow-up appointment °• Jack Amos °o [address], [place], [place]  [address] °o [phone]   Fax - [phone] °• [facility] °o [address], [place], [place]  [address] °o Phone - [phone]   Fax - [phone] °• Shilpa Gosrani °o [address], [place], [place]  [address] °o [phone] ° °[place] ([address]) °• [facility] °o Bates, MD; [place], MD; Cooper, Cox, MD; MD; Davis, MD; Dovico, MD; Ettefaugh, MD; Little, MD; Lowe, MD; Keiffer, MD; Melvin, MD; Sumner, MD; Williams, MD °o [address], [place], [place] [address] °o [phone] °o Mon-Fri 8:30-5:00 (extended evenings Mon-Thur as needed), Sat-Sun  10:00-1:00 °o Providers come to see babies at [facility] °o Accepting Medicaid for families of first-time babies and families with all children in the household age 3 and under. Must register with office prior to making appointment (M-F only). °• [facility] °o Henson, NP; Knapp, MD; Lalonde, MD; Tysinger, PA °o [address] [place], [place] [address] °o [phone] °o Mon-Fri 8:00-5:00 °o Babies seen by providers at [facility] °o Does NOT accept Medicaid/Commercial Insurance Only °• [facility] ([facility]  °o Artis, MD; Barnes, MD; Bratton, MD; Coccaro, MD; Lockett Gardner, MD; Kramer, MD; Marshall, MD; Netherton, MD; Poleto, MD; Skinner, MD °o [address] [place], [place] [address] °o [phone] °o Mon-Fri 8:30-5:30, Sat (Oct.-Mar.) 9:00-1:00 °o Babies seen by providers at [facility] °o Accepting Medicaid ° °[place] ([address]) °• [facility] °o Reid, MD; Warner, MD °o [address], [place], [place] [address] °o [phone] °o Mon-Fri 8:30-5:00, Sat 8:30-12:00 °o Providers come to see babies at [facility] °o Does NOT accept Medicaid °• [facility] °o Becker, PA; Hagler, MD; Scifres, PA; Sun, MD; Swayne, MD °o [address], [place], [place] [address] °o [phone] °o Mon-Fri 8:00-5:00 °o Babies seen by providers at [facility] °o Does NOT accept Medicaid °o Only accepting babies of parents who are patients °o Please call   early in hospitalization for appointment (limited availability) °• [facility] °o Clark, MD; Frye, MD; Kelleher, MD; Mack, NP; Miller, MD; O'Keller, MD; Patterson, NP; Pudlo, MD; Puzio, MD; Thomas, MD; Tucker, MD; Twiselton, MD °o [address], [place], [place] [address] °o [phone] °o Mon-Fri 8:00-5:00, Sat 9:00-12:00 °o Providers come to see babies at [facility] °o Does NOT accept  Medicaid ° °[place] ([address]) °• [facility] °o Limited providers accepting new patients: Brake, NP; Wharton, PA °o [address], [place], [place] [address] °o [phone] °o Mon-Fri 8:00-5:00 °o Babies seen by providers at [facility] °o Does NOT accept Medicaid °o Only accepting babies of parents who are patients °o Please call early in hospitalization for appointment (limited availability) °• [facility] °o Gay, MD; Quinlan, MD °o [address] [place], [place] [address] °o [phone] (press 1 to schedule appointment) °o Mon-Fri 8:00-5:00 °o Providers come to see babies at [facility] °o Does NOT accept Medicaid °• [facility] °o Mazer, MD °o [address] [place], [place] [address] °o [phone] °o Mon-Fri 8:30-5:00 (lunch 12:30-1:00), extended hours by appointment only Wed 5:00-6:30 °o Babies seen by [facility] providers °o Accepting Medicaid °• [facility] °o Banks, MD; Jordan, MD; Koberlein, MD °o [address], [place], [place] [address] °o [phone] °o Mon-Fri 8:00-5:00 °o Babies seen by [facility] providers °o Does NOT accept Medicaid °• [facility] °o Parker, MD; Hunter, MD; Wallace, DO °o [address] [place], [place] [address] °o [phone] °o Mon-Fri 8:00-5:00 °o Babies seen by [facility] providers °o Does NOT accept Medicaid °• [facility] °o Brandon, PA; Brecken, PA; Christy, NP; Dees, MD; DeClaire, MD; DeWeese, MD; Hansen, NP; Mills, NP; Parrish, NP; Smoot, NP; Summer, MD; Vapne, MD °o [address] [place], [place] [address] °o [phone] °o Mon-Fri 8:30-5:00, Sat 10:00-1:00 °o Providers come to see babies at [facility] °o Does NOT accept Medicaid °o Free prenatal information session Tuesdays at 4:45pm °• [facility] °o Bouska, MD; Gordon, PA; Jeffery, PA; Weber, PA °o [address] [place] [address] °o [phone] °o Mon-Fri 7:30-5:30 °o Babies seen by [facility] providers °• [facility] °o [address], [place], [place]  [address] °o [phone]   Fax - [phone] ° °[place] ([address]) °• [facility] °o Reese, MD °o [address] [place], [place] [address] °o [phone] °o Mon-Thur 8:00-6:00 °o Providers come to see babies at [facility] °o Accepting Medicaid °• [facility] °o Anderson, NP; Badger, MD; Beal, PA; Spencer, PA °o [address] [place], [place] [address] °o [phone] °o Mon-Thur 7:30-7:30, Fri 7:30-4:30 °o Babies seen by [facility] providers °o Accepting Medicaid °• [facility] °o Agbuya, MD; Klett, NP; Romgoolam, MD °o [address], [place],  [address] °o [phone] °o Mon-Fri 8:30-5:00, Sat 8:30-12:00 °o Providers come to see babies at [facility] °o Accepting Medicaid °o Must have “Meet & Greet” appointment at office prior to delivery °• [facility] ([facility] °o McCord, MD; Wallace, MD; Wood, MD °o [address], [place],  [address] °o [phone] °o Mon-Wed 8:00-6:00, Thur-Fri 8:00-5:00, Sat 9:00-12:00 °o Providers come to see babies at [facility] °o Does NOT accept Medicaid °o Only accepting siblings of current patients °• [facility]  °  o [address], [place], [place]  [address] °o [phone]   Fax - [phone] °• [facility] °o [address], [place], [place]  [address] °o [phone]   Fax - [phone] ° °[place] ([address]) °• [facility] °o Cirigliano, DO; Matthews, DO °o [address] [place], [place] [address] °o [phone] °o Mon-Fri 7:00-5:00 °o Babies seen by [facility] providers °o Does NOT  accept Medicaid °• [facility] °o Briscoe, MD; Howley, PA; Moreira, PA °o [address], [place], [place] [address] °o [phone] °o Mon-Fri 8:00-5:00 °o Babies seen by [facility] providers °o Accepting Medicaid °• [facility] °o Boyd, MD; Church, PA; Jones, NP; Osborn, PA °o [address], [place], [place] [address] °o [phone] °o Mon-Fri 8:00-5:00 °o Babies seen by providers at [facility] °o Accepting Medicaid ° °[place] ([address]) °• [facility] [place] °o Wendling, DO °o [address] [place], [place] [address] °o [phone] °o Mon-Fri 8:00-5:00 °o Babies seen by [facility] providers °o Does NOT accept Medicaid °o Limited availability, please call early in hospitalization to schedule follow-up °• [facility] °o Calderon, PA; Cummings, MD; Dillard, MD; Martin, PA; Olson, MD; VanDeven, PA °o [address], [place], [place] [address] °o [phone] °o Mon-Fri 8:30-5:00, Sat 9:00-12:00 °o Babies seen by providers at [facility] °o Accepting Medicaid °o Please register online then schedule online or call office °o [email or web address] °• [facility] ([facility] °o Hunter, NP; Kumar, MD; Martin Rogers, PA °o [address], [place], [place] [address] °o [phone] °o Mon-Fri 8:00-5:00 °o Babies seen by providers at [facility] °o Accepting Medicaid °• [facility] ([facility] °o Holiday [place], MD; Kristi Fleenor, NP; West, MD °o [address], [place], [place] [address] °o [phone] °o Mon-Fri 8:00-5:30, Sat&Sun by appointment (phones open at 8:30) °o Babies seen by [facility] providers °o Accepting Medicaid °o Must be a first-time baby or sibling of current patient °• [facility] [place]  °o [address], [place], [place]  [address] °o [phone]   Fax - [phone] ° °[place] ([address]) °• [facility] [place] [facility] °o Brown, PA; Cowen, PA; Rice, MD; Helton, PA; Spry, MD °o [address] [place], [place] [address] °o [phone] °o Mon-Thur 8:00-7:00, Fri 8:00-5:00, Sat 8:00-12:00, Sun 9:00-12:00 °o Babies seen by [facility] providers °o Accepting Medicaid °• [facility] [place] °o Coe-Goins, MD; Marshall, MD; Pierre-Louis, MD °o [address], [place], [place] [address] °o [phone] °o Mon-Thur 8:00-5:00 °o Babies seen by providers at [facility] °o Accepting Medicaid °• [facility] °o Bratton, MD; Coe-Goins, MD; Hayes, MD; Lewis, MD; List, MD; Lott, MD; Marshall, MD; Moran, MD; O'Neal, MD; Pierre-Louis, MD; Pitonzo, MD; Scholer, MD; Spangle, MD °o [address] [place], [place] [address] °o [phone] °o Mon-Fri 8:00-5:30, Sat (Oct.-Mar.) 9:00-1:00 °o Babies seen by providers at [facility] °o Accepting Medicaid °o Must fill out new patient packet, available online at [email or web address] °• [facility] ([facility]) °o Friddle, NP; Harris, NP; Kelly, NP; Logan,   MD; Alinda Money, [place]; Hennie Duos, MD; Wynne Dust, MD; Kavin Leech, NP o [address], [place], [place] [address] o [phone] o Mon-Thur 8:00-5:30, Fri 8:00-5:00 o Babies seen by providers at [facility] o Accepting [facility] [phone]) . [facility] o [place], [place]; [place], MD; Tanya Nones, MD; [place], [place] o [address] [place], [place] [address] o [phone] o Mon-Fri 8:00-5:00 o Babies seen by providers at [facility] o Accepting [facility] [phone]) . [facility] o [place], DO; Lenise Arena, MD; [place], [place] o [address], [place], [place]  [address] o [phone] o Mon-Fri 8:00-5:00 o Babies seen by providers at [facility] o Does NOT accept Medicaid o Limited appointment availability, please call early in hospitalization  . Nature conservation officer at [facility] o [place], DO; [place], MD o [address] [place], [place] [address] o [phone] o Mon-Fri 8:00-5:00 o Babies seen by [facility] providers o Does NOT accept Medicaid . [facility] Lorrine Kin, MD; Ninetta Lights, MD; [place], [place]; [place], MD o [address], [place], [place] [address] o [phone] o Mon-Fri 8:00-5:00 o After hours clinic [facility] [place], [place] [address]) [phone] Mon-Fri 5:00-8:00, Sat 12:00-6:00, Sun 10:00-4:00 o Babies seen by [facility] providers o Accepting Medicaid . [facility] at [facility] o [address] [place], [place]  [address] o [phone]   Fax - [phone]  [place] [phone]) . Nature conservation officer at [facility], MD o [address] [place], [place], [place] [address] o [phone] o Mon-Fri 8:00-5:00 o Babies seen by [facility] providers o Does NOT accept Medicaid . [facility] [place] [facility] [place]) Tomi Likens, MD o [address] [place] [address] [place], [place] [address] o [phone] o Mon-Thur 8:00-7:00, Fri 8:00-5:00, Sat 8:00-12:00 o Babies seen by providers at [facility] o Accepting Medicaid - but does not have vaccinations in office (must be received elsewhere) o Limited availability, please call early in hospitalization  [place] ([address]) . [facility]  o Wyvonne Lenz, MD o [address], [place] [address] o [phone]  Fax [phone]   Local Doulas: Cora Daniels Doulas [email or web address] Tel: [phone] [email or web address] [email or web address] [phone] [email or web address] The Labor Merla Riches  (also do waterbirth tub rental) [phone] [email or web address] [phone] [email or web address] [facility] Rhythms  [phone] [email or web address] [employer] (PADA) [email or web address] [email or web address]  [email or web address]

## 2019-09-16 NOTE — Progress Notes (Signed)
   PRENATAL VISIT NOTE  Subjective:  Laurie Woodard is a 24 y.o. G2P0010 at [redacted]w[redacted]d being seen today for ongoing prenatal care.  She is currently monitored for the following issues for this low-risk pregnancy and has Supervision of low-risk pregnancy and Late prenatal care affecting pregnancy on their problem list.  Patient reports no complaints.  Contractions: Not present. Vag. Bleeding: None.  Movement: Present. Denies leaking of fluid.   The following portions of the patient's history were reviewed and updated as appropriate: allergies, current medications, past family history, past medical history, past social history, past surgical history and problem list.   Objective:   Vitals:   09/16/19 0936  BP: 123/68  Pulse: 85  Weight: 166 lb 6.4 oz (75.5 kg)    Fetal Status: Fetal Heart Rate (bpm): 143 Fundal Height: 34 cm Movement: Present     General:  Alert, oriented and cooperative. Patient is in no acute distress.  Skin: Skin is warm and dry. No rash noted.   Cardiovascular: Normal heart rate noted  Respiratory: Normal respiratory effort, no problems with respiration noted  Abdomen: Soft, gravid, appropriate for gestational age.  Pain/Pressure: Absent     Pelvic: Cervical exam deferred        Extremities: Normal range of motion.  Edema: None  Mental Status: Normal mood and affect. Normal behavior. Normal judgment and thought content.   Assessment and Plan:  Pregnancy: G2P0010 at [redacted]w[redacted]d 1. Encounter for supervision of low-risk pregnancy in third trimester - Doing well - Peds list given and discussed the importance of choosing a pediatrician before 36 weeks  - Follow-up US scheduled 3/16, last [place] with EFW 14% - Interested in a Doula, information given   2. Late prenatal care affecting pregnancy in third trimester  Preterm labor symptoms and general obstetric precautions including but not limited to vaginal bleeding, contractions, leaking of fluid and fetal movement were reviewed in  detail with the patient. Please refer to After Visit Summary for other counseling recommendations.   Return in about 2 weeks (around 09/30/2019) for LOB, In-Person.  Future Appointments  Date Time Provider Department Center  09/23/2019  9:35 AM Currie Paris, NP [facility]  09/30/2019  9:35 AM Nugent, Odie Sera, NP [facility]  09/30/2019 11:15 AM [facility] NURSE [facility]  09/30/2019 11:15 AM [facility] [place] 4 [facility]-US  10/07/2019  9:35 AM Armando Reichert, CNM [facility]    Vonzella Nipple, PA-C

## 2019-09-23 ENCOUNTER — Telehealth (INDEPENDENT_AMBULATORY_CARE_PROVIDER_SITE_OTHER): Payer: BC Managed Care – PPO | Admitting: Nurse Practitioner

## 2019-09-23 ENCOUNTER — Encounter: Payer: Self-pay | Admitting: Nurse Practitioner

## 2019-09-23 DIAGNOSIS — Z3A35 35 weeks gestation of pregnancy: Secondary | ICD-10-CM

## 2019-09-23 DIAGNOSIS — Z3493 Encounter for supervision of normal pregnancy, unspecified, third trimester: Secondary | ICD-10-CM

## 2019-09-23 DIAGNOSIS — O0933 Supervision of pregnancy with insufficient antenatal care, third trimester: Secondary | ICD-10-CM

## 2019-09-23 NOTE — Progress Notes (Signed)
I connected with  [name] on 09/23/19 at  9:35 AM EST by telephone and verified that I am speaking with the correct person using two identifiers.   I discussed the limitations, risks, security and privacy concerns of performing an evaluation and management service by telephone and the availability of in person appointments. I also discussed with the patient that there may be a patient responsible charge related to this service. The patient expressed understanding and agreed to proceed.  Henrietta Dine, CMA 09/23/2019  9:28 AM

## 2019-09-23 NOTE — Progress Notes (Signed)
I connected with@ on 09/23/19 at  9:35 AM EST by: MyChart and verified that I am speaking with the correct person using two identifiers.  Patient is located at home and provider is located at [facility].     The purpose of this virtual visit is to provide medical care while limiting exposure to the novel coronavirus. I discussed the limitations, risks, security and privacy concerns of performing an evaluation and management service by MyChart and the availability of in person appointments. I also discussed with the patient that there may be a patient responsible charge related to this service. By engaging in this virtual visit, you consent to the provision of healthcare.  Additionally, you authorize for your insurance to be billed for the services provided during this visit.  The patient expressed understanding and agreed to proceed.  The following staff members participated in the virtual visit:  Marykay Lex, CMA and Nolene Bernheim, NP    PRENATAL VISIT NOTE  Subjective:  Laurie Woodard is a 24 y.o. G2P0010 at [redacted]w[redacted]d  for phone visit for ongoing prenatal care.  She is currently monitored for the following issues for this low-risk pregnancy and has Supervision of low-risk pregnancy and Late prenatal care affecting pregnancy on their problem list.  Patient reports no complaints.  Contractions: Not present. Vag. Bleeding: None.  Movement: Present. Denies leaking of fluid.   The following portions of the patient's history were reviewed and updated as appropriate: allergies, current medications, past family history, past medical history, past social history, past surgical history and problem list.   Objective:   Vitals:   09/23/19 0929  BP: 128/69  Pulse: 80   Self-Obtained  Fetal Status:     Movement: Present     Assessment and Plan:  Pregnancy: G2P0010 at [redacted]w[redacted]d 1. Encounter for supervision of low-risk pregnancy in third trimester Doing well - recording BP in babyscripts weekly.  Data  reviewed.  Normal BPs.  No edema. Linden Dolin class is today Decided not to use doula Reviewed [place] with patient - had talked with MFM about possible induction if fetal growth restriction found but last [place] on 09-09-19, baby was at 14%tile - so at this time, induction is not planned. [place] is scheduled again in 1 week.  2. Late prenatal care affecting pregnancy in third trimester   Preterm labor symptoms and general obstetric precautions including but not limited to vaginal bleeding, contractions, leaking of fluid and fetal movement were reviewed in detail with the patient.  No follow-ups on file.  Future Appointments  Date Time Provider Department Center  09/30/2019  9:35 AM Nugent, Odie Sera, NP [facility]  09/30/2019 11:15 AM [facility] NURSE [facility] MFC-US  09/30/2019 11:15 AM [facility] [place] 4 WH-MFCUS MFC-US  10/07/2019  9:35 AM Armando Reichert, CNM [facility]     Time spent on virtual visit: 8 minutes  Currie Paris, NP

## 2019-09-30 ENCOUNTER — Other Ambulatory Visit: Payer: Self-pay

## 2019-09-30 ENCOUNTER — Encounter: Payer: Self-pay | Admitting: Women's Health

## 2019-09-30 ENCOUNTER — Ambulatory Visit (HOSPITAL_COMMUNITY)
Admission: RE | Admit: 2019-09-30 | Discharge: 2019-09-30 | Disposition: A | Discharge status: Home / Self Care | Payer: BC Managed Care – PPO | Source: Ambulatory Visit | Attending: Obstetrics and Gynecology | Admitting: Obstetrics and Gynecology

## 2019-09-30 ENCOUNTER — Telehealth (INDEPENDENT_AMBULATORY_CARE_PROVIDER_SITE_OTHER): Payer: BC Managed Care – PPO | Admitting: Women's Health

## 2019-09-30 ENCOUNTER — Encounter (HOSPITAL_COMMUNITY): Payer: Self-pay

## 2019-09-30 ENCOUNTER — Ambulatory Visit (HOSPITAL_COMMUNITY): Payer: BC Managed Care – PPO | Admitting: *Deleted

## 2019-09-30 ENCOUNTER — Other Ambulatory Visit (HOSPITAL_COMMUNITY)
Admission: RE | Admit: 2019-09-30 | Discharge: 2019-09-30 | Disposition: A | Discharge status: Home / Self Care | Payer: BC Managed Care – PPO | Source: Ambulatory Visit | Attending: Women's Health | Admitting: Women's Health

## 2019-09-30 VITALS — BP 118/74 | HR 87

## 2019-09-30 DIAGNOSIS — O0933 Supervision of pregnancy with insufficient antenatal care, third trimester: Secondary | ICD-10-CM

## 2019-09-30 DIAGNOSIS — IMO0002 Reserved for concepts with insufficient information to code with codable children: Secondary | ICD-10-CM

## 2019-09-30 DIAGNOSIS — Z3A36 36 weeks gestation of pregnancy: Secondary | ICD-10-CM

## 2019-09-30 DIAGNOSIS — Z3493 Encounter for supervision of normal pregnancy, unspecified, third trimester: Secondary | ICD-10-CM

## 2019-09-30 NOTE — Progress Notes (Signed)
I connected with  Laurie Woodard on 09/30/19 at  9:35 AM EDT by telephone and verified that I am speaking with the correct person using two identifiers.   I discussed the limitations, risks, security and privacy concerns of performing an evaluation and management service by telephone and the availability of in person appointments. I also discussed with the patient that there may be a patient responsible charge related to this service. The patient expressed understanding and agreed to proceed.  Janene Madeira Zakariah Urwin, CMA 09/30/2019  9:44 AM

## 2019-09-30 NOTE — Patient Instructions (Addendum)
Maternity Assessment Unit (MAU)  The Maternity Assessment Unit (MAU) is located at the [facility] at [facility]. The address is: [address] [place], [place], [place] [address]. Please see map below for additional directions.    The Maternity Assessment Unit is designed to help you during your pregnancy, and for up to 6 weeks after delivery, with any pregnancy- or postpartum-related emergencies, if you think you are in labor, or if your water has broken. For example, if you experience nausea and vomiting, vaginal bleeding, severe abdominal or pelvic pain, elevated blood pressure or other problems related to your pregnancy or postpartum time, please come to the Maternity Assessment Unit for assistance.    Third Trimester of Pregnancy The third trimester is from week 28 through week 40 (months 7 through 9). The third trimester is a time when the unborn baby (fetus) is growing rapidly. At the end of the ninth month, the fetus is about 20 inches in length and weighs 6-10 pounds. Body changes during your third trimester Your body will continue to go through many changes during pregnancy. The changes vary from woman to woman. During the third trimester:  Your weight will continue to increase. You can expect to gain 25-35 pounds (11-16 kg) by the end of the pregnancy.  You may begin to get stretch marks on your hips, abdomen, and breasts.  You may urinate more often because the fetus is moving lower into your pelvis and pressing on your bladder.  You may develop or continue to have heartburn. This is caused by increased hormones that slow down muscles in the digestive tract.  You may develop or continue to have constipation because increased hormones slow digestion and cause the muscles that push waste through your intestines to relax.  You may develop hemorrhoids. These are swollen veins (varicose veins) in the rectum that can itch or be painful.  You may  develop swollen, bulging veins (varicose veins) in your legs.  You may have increased body aches in the pelvis, back, or thighs. This is due to weight gain and increased hormones that are relaxing your joints.  You may have changes in your hair. These can include thickening of your hair, rapid growth, and changes in texture. Some women also have hair loss during or after pregnancy, or hair that feels dry or thin. Your hair will most likely return to normal after your baby is born.  Your breasts will continue to grow and they will continue to become tender. A yellow fluid (colostrum) may leak from your breasts. This is the first milk you are producing for your baby.  Your belly button may stick out.  You may notice more swelling in your hands, face, or ankles.  You may have increased tingling or numbness in your hands, arms, and legs. The skin on your belly may also feel numb.  You may feel short of breath because of your expanding uterus.  You may have more problems sleeping. This can be caused by the size of your belly, increased need to urinate, and an increase in your body's metabolism.  You may notice the fetus "dropping," or moving lower in your abdomen (lightening).  You may have increased vaginal discharge.  You may notice your joints feel loose and you may have pain around your pelvic bone. What to expect at prenatal visits You will have prenatal exams every 2 weeks until week 36. Then you will have weekly prenatal exams. During a routine prenatal  visit:  You will be weighed to make sure you and the baby are growing normally.  Your blood pressure will be taken.  Your abdomen will be measured to track your baby's growth.  The fetal heartbeat will be listened to.  Any test results from the previous visit will be discussed.  You may have a cervical check near your due date to see if your cervix has softened or thinned (effaced).  You will be tested for Group B  streptococcus. This happens between 35 and 37 weeks. Your health care provider may ask you:  What your birth plan is.  How you are feeling.  If you are feeling the baby move.  If you have had any abnormal symptoms, such as leaking fluid, bleeding, severe headaches, or abdominal cramping.  If you are using any tobacco products, including cigarettes, chewing tobacco, and electronic cigarettes.  If you have any questions. Other tests or screenings that may be performed during your third trimester include:  Blood tests that check for low iron levels (anemia).  Fetal testing to check the health, activity level, and growth of the fetus. Testing is done if you have certain medical conditions or if there are problems during the pregnancy.  Nonstress test (NST). This test checks the health of your baby to make sure there are no signs of problems, such as the baby not getting enough oxygen. During this test, a belt is placed around your belly. The baby is made to move, and its heart rate is monitored during movement. What is false labor? False labor is a condition in which you feel small, irregular tightenings of the muscles in the womb (contractions) that usually go away with rest, changing position, or drinking water. These are called Braxton Hicks contractions. Contractions may last for hours, days, or even weeks before true labor sets in. If contractions come at regular intervals, become more frequent, increase in intensity, or become painful, you should see your health care provider. What are the signs of labor?  Abdominal cramps.  Regular contractions that start at 10 minutes apart and become stronger and more frequent with time.  Contractions that start on the top of the uterus and spread down to the lower abdomen and back.  Increased pelvic pressure and dull back pain.  A watery or bloody mucus discharge that comes from the vagina.  Leaking of amniotic fluid. This is also known as  your "water breaking." It could be a slow trickle or a gush. Let your health care provider know if it has a color or strange odor. If you have any of these signs, call your health care provider right away, even if it is before your due date. Follow these instructions at home: Medicines  Follow your health care provider's instructions regarding medicine use. Specific medicines may be either safe or unsafe to take during pregnancy.  Take a prenatal vitamin that contains at least 600 micrograms (mcg) of folic acid.  If you develop constipation, try taking a stool softener if your health care provider approves. Eating and drinking   Eat a balanced diet that includes fresh fruits and vegetables, whole grains, good sources of protein such as meat, eggs, or tofu, and low-fat dairy. Your health care provider will help you determine the amount of weight gain that is right for you.  Avoid raw meat and uncooked cheese. These carry germs that can cause birth defects in the baby.  If you have low calcium intake from food, talk to your  health care provider about whether you should take a daily calcium supplement.  Eat four or five small meals rather than three large meals a day.  Limit foods that are high in fat and processed sugars, such as fried and sweet foods.  To prevent constipation: ? Drink enough fluid to keep your urine clear or pale yellow. ? Eat foods that are high in fiber, such as fresh fruits and vegetables, whole grains, and beans. Activity  Exercise only as directed by your health care provider. Most women can continue their usual exercise routine during pregnancy. Try to exercise for 30 minutes at least 5 days a week. Stop exercising if you experience uterine contractions.  Avoid heavy lifting.  Do not exercise in extreme heat or humidity, or at high altitudes.  Wear low-heel, comfortable shoes.  Practice good posture.  You may continue to have sex unless your health care  provider tells you otherwise. Relieving pain and discomfort  Take frequent breaks and rest with your legs elevated if you have leg cramps or low back pain.  Take warm sitz baths to soothe any pain or discomfort caused by hemorrhoids. Use hemorrhoid cream if your health care provider approves.  Wear a good support bra to prevent discomfort from breast tenderness.  If you develop varicose veins: ? Wear support pantyhose or compression stockings as told by your healthcare provider. ? Elevate your feet for 15 minutes, 3-4 times a day. Prenatal care  Write down your questions. Take them to your prenatal visits.  Keep all your prenatal visits as told by your health care provider. This is important. Safety  Wear your seat belt at all times when driving.  Make a list of emergency phone numbers, including numbers for family, friends, the hospital, and police and fire departments. General instructions  Avoid cat litter boxes and soil used by cats. These carry germs that can cause birth defects in the baby. If you have a cat, ask someone to clean the litter box for you.  Do not travel far distances unless it is absolutely necessary and only with the approval of your health care provider.  Do not use hot tubs, steam rooms, or saunas.  Do not drink alcohol.  Do not use any products that contain nicotine or tobacco, such as cigarettes and e-cigarettes. If you need help quitting, ask your health care provider.  Do not use any medicinal herbs or unprescribed drugs. These chemicals affect the formation and growth of the baby.  Do not douche or use tampons or scented sanitary pads.  Do not cross your legs for long periods of time.  To prepare for the arrival of your baby: ? Take prenatal classes to understand, practice, and ask questions about labor and delivery. ? Make a trial run to the hospital. ? Visit the hospital and tour the maternity area. ? Arrange for maternity or paternity leave  through employers. ? Arrange for family and friends to take care of pets while you are in the hospital. ? Purchase a rear-facing car seat and make sure you know how to install it in your car. ? Pack your hospital bag. ? Prepare the baby's nursery. Make sure to remove all pillows and stuffed animals from the baby's crib to prevent suffocation.  Visit your dentist if you have not gone during your pregnancy. Use a soft toothbrush to brush your teeth and be gentle when you floss. Contact a health care provider if:  You are unsure if you are in labor  or if your water has broken.  You become dizzy.  You have mild pelvic cramps, pelvic pressure, or nagging pain in your abdominal area.  You have lower back pain.  You have persistent nausea, vomiting, or diarrhea.  You have an unusual or bad smelling vaginal discharge.  You have pain when you urinate. Get help right away if:  Your water breaks before 37 weeks.  You have regular contractions less than 5 minutes apart before 37 weeks.  You have a fever.  You are leaking fluid from your vagina.  You have spotting or bleeding from your vagina.  You have severe abdominal pain or cramping.  You have rapid weight loss or weight gain.  You have shortness of breath with chest pain.  You notice sudden or extreme swelling of your face, hands, ankles, feet, or legs.  Your baby makes fewer than 10 movements in 2 hours.  You have severe headaches that do not go away when you take medicine.  You have vision changes. Summary  The third trimester is from week 28 through week 40, months 7 through 9. The third trimester is a time when the unborn baby (fetus) is growing rapidly.  During the third trimester, your discomfort may increase as you and your baby continue to gain weight. You may have abdominal, leg, and back pain, sleeping problems, and an increased need to urinate.  During the third trimester your breasts will keep growing and they  will continue to become tender. A yellow fluid (colostrum) may leak from your breasts. This is the first milk you are producing for your baby.  False labor is a condition in which you feel small, irregular tightenings of the muscles in the womb (contractions) that eventually go away. These are called Braxton Hicks contractions. Contractions may last for hours, days, or even weeks before true labor sets in.  Signs of labor can include: abdominal cramps; regular contractions that start at 10 minutes apart and become stronger and more frequent with time; watery or bloody mucus discharge that comes from the vagina; increased pelvic pressure and dull back pain; and leaking of amniotic fluid. This information is not intended to replace advice given to you by your health care provider. Make sure you discuss any questions you have with your health care provider. Document Revised: 10/24/2018 Document Reviewed: 08/08/2016 Elsevier Patient Education  2020 [employer].   Signs and Symptoms of Labor Labor is your body's natural process of moving your baby, placenta, and umbilical cord out of your uterus. The process of labor usually starts when your baby is full-term, between 53 and 40 weeks of pregnancy. How will I know when I am close to going into labor? As your body prepares for labor and the birth of your baby, you may notice the following symptoms in the weeks and days before true labor starts:  Having a strong desire to get your home ready to receive your new baby. This is called nesting. Nesting may be a sign that labor is approaching, and it may occur several weeks before birth. Nesting may involve cleaning and organizing your home.  Passing a small amount of thick, bloody mucus out of your vagina (normal bloody show or losing your mucus plug). This may happen more than a week before labor begins, or it might occur right before labor begins as the opening of the cervix starts to widen (dilate). For  some women, the entire mucus plug passes at once. For others, smaller portions of the  mucus plug may gradually pass over several days.  Your baby moving (dropping) lower in your pelvis to get into position for birth (lightening). When this happens, you may feel more pressure on your bladder and pelvic bone and less pressure on your ribs. This may make it easier to breathe. It may also cause you to need to urinate more often and have problems with bowel movements.  Having "practice contractions" (Braxton Hicks contractions) that occur at irregular (unevenly spaced) intervals that are more than 10 minutes apart. This is also called false labor. False labor contractions are common after exercise or sexual activity, and they will stop if you change position, rest, or drink fluids. These contractions are usually mild and do not get stronger over time. They may feel like: ? A backache or back pain. ? Mild cramps, similar to menstrual cramps. ? Tightening or pressure in your abdomen. Other early symptoms that labor may be starting soon include:  Nausea or loss of appetite.  Diarrhea.  Having a sudden burst of energy, or feeling very tired.  Mood changes.  Having trouble sleeping. How will I know when labor has begun? Signs that true labor has begun may include:  Having contractions that come at regular (evenly spaced) intervals and increase in intensity. This may feel like more intense tightening or pressure in your abdomen that moves to your back. ? Contractions may also feel like rhythmic pain in your upper thighs or back that comes and goes at regular intervals. ? For first-time mothers, this change in intensity of contractions often occurs at a more gradual pace. ? Women who have given birth before may notice a more rapid progression of contraction changes.  Having a feeling of pressure in the vaginal area.  Your water breaking (rupture of membranes). This is when the sac of fluid that  surrounds your baby breaks. When this happens, you will notice fluid leaking from your vagina. This may be clear or blood-tinged. Labor usually starts within 24 hours of your water breaking, but it may take longer to begin. ? Some women notice this as a gush of fluid. ? Others notice that their underwear repeatedly becomes damp. Follow these instructions at home:   When labor starts, or if your water breaks, call your health care provider or nurse care line. Based on your situation, they will determine when you should go in for an exam.  When you are in early labor, you may be able to rest and manage symptoms at home. Some strategies to try at home include: ? Breathing and relaxation techniques. ? Taking a warm bath or shower. ? Listening to music. ? Using a heating pad on the lower back for pain. If you are directed to use heat:  Place a towel between your skin and the heat source.  Leave the heat on for 20-30 minutes.  Remove the heat if your skin turns bright red. This is especially important if you are unable to feel pain, heat, or cold. You may have a greater risk of getting burned. Get help right away if:  You have painful, regular contractions that are 5 minutes apart or less.  Labor starts before you are [redacted] weeks along in your pregnancy.  You have a fever.  You have a headache that does not go away.  You have bright red blood coming from your vagina.  You do not feel your baby moving.  You have a sudden onset of: ? Severe headache with vision problems. ?  Nausea, vomiting, or diarrhea. ? Chest pain or shortness of breath. These symptoms may be an emergency. If your health care provider recommends that you go to the hospital or birth center where you plan to deliver, do not drive yourself. Have someone else drive you, or call emergency services (911 in the [place] Summary  Labor is your body's natural process of moving your baby, placenta, and umbilical cord out of your  uterus.  The process of labor usually starts when your baby is full-term, between 80 and 40 weeks of pregnancy.  When labor starts, or if your water breaks, call your health care provider or nurse care line. Based on your situation, they will determine when you should go in for an exam. This information is not intended to replace advice given to you by your health care provider. Make sure you discuss any questions you have with your health care provider. Document Revised: 04/02/2017 Document Reviewed: 12/08/2016 Elsevier Patient Education  2020 Else[employer] Inc[employer]    Group B Streptococcus Test During Pregnancy Why am I having this test? Routine testing, also called screening, for group B streptococcus (GBS) is recommended for all pregnant women between the 36th and 37th week of pregnancy. GBS is a type of bacteria that can be passed from mother to baby during childbirth. Screening will help guide whether or not you will need treatment during labor and delivery to prevent complications such as:  An infection in your uterus during labor.  An infection in your uterus after delivery.  A serious infection in your baby after delivery, such as pneumonia, meningitis, or sepsis. GBS screening is not often done before 36 weeks of pregnancy unless you go into labor prematurely. What happens if I have group B streptococcus? If testing shows that you have GBS, your health care provider will recommend treatment with IV antibiotics during labor and delivery. This treatment significantly decreases the risk of complications for you and your baby. If you have a planned C-section and you have GBS, you may not need to be treated with antibiotics because GBS is usually passed to babies after labor starts and your water breaks. If you are in labor or your water breaks before your C-section, it is possible for GBS to get into your uterus and be passed to your baby, so you might need treatment. Is there a chance I may  not need to be tested? You may not need to be tested for GBS if:  You have a urine test that shows GBS before 36 to 37 weeks.  You had a baby with GBS infection after a previous delivery. In these cases, you will automatically be treated for GBS during labor and delivery. What is being tested? This test is done to check if you have group B streptococcus in your vagina or rectum. What kind of sample is taken? To collect samples for this test, your health care provider will swab your vagina and rectum with a cotton swab. The sample is then sent to the lab to see if GBS is present. What happens during the test?   You will remove your clothing from the waist down.  You will lie down on an exam table in the same position as you would for a pelvic exam.  Your health care provider will swab your vagina and rectum to collect samples for a culture test.  You will be able to go home after the test and do all your usual activities. How are the results reported? The  test results are reported as positive or negative. What do the results mean?  A positive test means you are at risk for passing GBS to your baby during labor and delivery. Your health care provider will recommend that you are treated with an IV antibiotic during labor and delivery.  A negative test means you are at very low risk of passing GBS to your baby. There is still a low risk of passing GBS to your baby because sometimes test results may report that you do not have a condition when you do (false-negative result) or there is a chance that you may become infected with GBS after the test is done. You most likely will not need to be treated with an antibiotic during labor and delivery. Talk with your health care provider about what your results mean. Questions to ask your health care provider Ask your health care provider, or the department that is doing the test:  When will my results be ready?  How will I get my  results?  What are my treatment options? Summary  Routine testing (screening) for group B streptococcus (GBS) is recommended for all pregnant women between the 36th and 37th week of pregnancy.  GBS is a type of bacteria that can be passed from mother to baby during childbirth.  If testing shows that you have GBS, your health care provider will recommend that you are treated with IV antibiotics during labor and delivery. This treatment almost always prevents infection in newborns. This information is not intended to replace advice given to you by your health care provider. Make sure you discuss any questions you have with your health care provider. Document Revised: 10/24/2018 Document Reviewed: 07/31/2018 Elsevier Patient Education  [employer].     .Places to have your son circumcised:                                                                      [facility]     [phone] while you are in hospital         [facility]              [phone]   $269 by 4 wks                      [facility]                     [phone]   $269 by 7 days [facility]                    [phone]   $269 by 4 wks [facility]             [phone]   $225 by 2 wks    These prices sometimes change but are roughly what you can expect to pay. Please call and confirm pricing.   Circumcision is considered an elective/non-medically necessary procedure. There are many reasons parents decide to have their sons circumsized. During the first year of life circumcised males have a reduced risk of urinary tract infections but after this year the rates between circumcised males and uncircumcised males are the same.  It is safe to have your son circumcised outside  of the hospital and the places above perform them regularly.   Deciding about Circumcision in Baby Boys  (Up-to-date The Basics)  What is circumcision?   Circumcision is a surgery that removes the skin that covers the tip of the penis, called the  "foreskin" Circumcision is usually done when a boy is between 68 and 71 days old. In the [place], circumcision is common. In some other countries, fewer boys are circumcised. Circumcision is a common tradition in some religions.  Should I have my baby boy circumcised?   There is no easy answer. Circumcision has some benefits. But it also has risks. After talking with your doctor, you will have to decide for yourself what is right for your family.  What are the benefits of circumcision?   Circumcised boys seem to have slightly lower rates of: ?Urinary tract infections ?Swelling of the opening at the tip of the penis Circumcised men seem to have slightly lower rates of: ?Urinary tract infections ?Swelling of the opening at the tip of the penis ?Penis cancer ?HIV and other infections that you catch during sex ?Cervical cancer in the women they have sex with Even so, in the [place], the risks of these problems are small - even in boys and men who have not been circumcised. Plus, boys and men who are not circumcised can reduce these extra risks by: ?Cleaning their penis well ?Using condoms during sex  What are the risks of circumcision?  Risks include: ?Bleeding or infection from the surgery ?Damage to or amputation of the penis ?A chance that the doctor will cut off too much or not enough of the foreskin ?A chance that sex won't feel as good later in life Only about 1 out of every 200 circumcisions leads to problems. There is also a chance that your health insurance won't pay for circumcision.  How is circumcision done in baby boys?  First, the baby gets medicine for pain relief. This might be a cream on the skin or a shot into the base of the penis. Next, the doctor cleans the baby's penis well. Then he or she uses special tools to cut off the foreskin. Finally, the doctor wraps a bandage (called gauze) around the baby's penis. If you have your baby circumcised, his doctor or  nurse will give you instructions on how to care for him after the surgery. It is important that you follow those instructions carefully.

## 2019-09-30 NOTE — Progress Notes (Signed)
I connected with Laurie Woodard on 09/30/19 at  9:35 AM EDT by: MyChart/phone and verified that I am speaking with the correct person using two identifiers.  Patient is located at home and provider is located at [facility].     The purpose of this virtual visit is to provide medical care while limiting exposure to the novel coronavirus. I discussed the limitations, risks, security and privacy concerns of performing an evaluation and management service by MyChart/phone and the availability of in person appointments. I also discussed with the patient that there may be a patient responsible charge related to this service. By engaging in this virtual visit, you consent to the provision of healthcare.  Additionally, you authorize for your insurance to be billed for the services provided during this visit.  The patient expressed understanding and agreed to proceed.  The following staff members participated in the virtual visit:  Donia Ast    PRENATAL VISIT NOTE  Subjective:  Laurie Woodard is a 24 y.o. G2P0010 at [redacted]w[redacted]d  for phone visit for ongoing prenatal care.  She is currently monitored for the following issues for this low-risk pregnancy and has Supervision of low-risk pregnancy and Late prenatal care affecting pregnancy on their problem list.  Patient reports no complaints.  Contractions: Not present. Vag. Bleeding: None.  Movement: Present. Denies leaking of fluid.   The following portions of the patient's history were reviewed and updated as appropriate: allergies, current medications, past family history, past medical history, past social history, past surgical history and problem list.   Objective:   Vitals:   09/30/19 0945  BP: 118/74  Pulse: 87   Self-Obtained  Fetal Status:     Movement: Present     Assessment and Plan:  Pregnancy: G2P0010 at [redacted]w[redacted]d  1. Encounter for supervision of low-risk pregnancy in third trimester - GBS not yet performed, pt will stop by after [place] today for  swabs only - pt scheduled for growth [place] today d/t possible SGA and small head circumference, pt aware - discussed outpatient vs. In patient circumcisions - Culture, beta strep (group b only) - Cervicovaginal ancillary only( [place])  Term labor symptoms and general obstetric precautions including but not limited to vaginal bleeding, contractions, leaking of fluid and fetal movement were reviewed in detail with the patient. I discussed the assessment and treatment plan with the patient. The patient was provided an opportunity to ask questions and all were answered. The patient agreed with the plan and demonstrated an understanding of the instructions. The patient was advised to call back or seek an in-person office evaluation/go to MAU at [facility] for any urgent or concerning symptoms.   Return in about 1 week (around 10/07/2019) for virtual ROB.  Future Appointments  Date Time Provider Department Center  09/30/2019 11:15 AM WH-MFC NURSE WH-MFC MFC-US  09/30/2019 11:15 AM WH-MFC [place] 4 WH-MFCUS MFC-US  10/01/2019 12:15 PM Myles Gip, DO PP-PIEDPED PP  10/07/2019  9:35 AM Armando Reichert, CNM WOC-WOCA WOC     Time spent on virtual visit: 8 minutes  Marylen Ponto, NP

## 2019-10-01 ENCOUNTER — Other Ambulatory Visit: Payer: Self-pay

## 2019-10-01 ENCOUNTER — Ambulatory Visit (INDEPENDENT_AMBULATORY_CARE_PROVIDER_SITE_OTHER): Payer: Self-pay | Admitting: Pediatrics

## 2019-10-01 DIAGNOSIS — Z3A36 36 weeks gestation of pregnancy: Secondary | ICD-10-CM

## 2019-10-01 DIAGNOSIS — Z362 Encounter for other antenatal screening follow-up: Secondary | ICD-10-CM | POA: Diagnosis not present

## 2019-10-01 DIAGNOSIS — Z7681 Expectant parent(s) prebirth pediatrician visit: Secondary | ICD-10-CM

## 2019-10-01 DIAGNOSIS — O0933 Supervision of pregnancy with insufficient antenatal care, third trimester: Secondary | ICD-10-CM

## 2019-10-01 DIAGNOSIS — O365930 Maternal care for other known or suspected poor fetal growth, third trimester, not applicable or unspecified: Secondary | ICD-10-CM

## 2019-10-01 LAB — CERVICOVAGINAL ANCILLARY ONLY
Chlamydia: NEGATIVE
Comment: NEGATIVE
Comment: NORMAL
Neisseria Gonorrhea: NEGATIVE

## 2019-10-04 LAB — CULTURE, BETA STREP (GROUP B ONLY): Strep Gp B Culture: NEGATIVE

## 2019-10-06 NOTE — Progress Notes (Signed)
Prenatal counseling for impending newborn done--  Reviewed vaccine policy.  1st child, Currently 37 weeks, Current complications: none, Prenatal care initiated:  Late ~22wks Z76.81

## 2019-10-07 ENCOUNTER — Encounter: Payer: Self-pay | Admitting: Advanced Practice Midwife

## 2019-10-07 ENCOUNTER — Telehealth (INDEPENDENT_AMBULATORY_CARE_PROVIDER_SITE_OTHER): Payer: BC Managed Care – PPO | Admitting: Advanced Practice Midwife

## 2019-10-07 ENCOUNTER — Other Ambulatory Visit: Payer: Self-pay

## 2019-10-07 DIAGNOSIS — Z3A37 37 weeks gestation of pregnancy: Secondary | ICD-10-CM

## 2019-10-07 DIAGNOSIS — O0933 Supervision of pregnancy with insufficient antenatal care, third trimester: Secondary | ICD-10-CM

## 2019-10-07 DIAGNOSIS — Z3493 Encounter for supervision of normal pregnancy, unspecified, third trimester: Secondary | ICD-10-CM

## 2019-10-07 NOTE — Progress Notes (Signed)
   TELEHEALTH VIRTUAL OBSTETRICS VISIT ENCOUNTER NOTE  I connected with Laurie Woodard on 10/07/19 at  9:35 AM EDT by telephone at home and verified that I am speaking with the correct person using two identifiers.   I discussed the limitations, risks, security and privacy concerns of performing an evaluation and management service by telephone and the availability of in person appointments. I also discussed with the patient that there may be a patient responsible charge related to this service. The patient expressed understanding and agreed to proceed.  Subjective:  Laurie Woodard is a 24 y.o. G2P0010 at [redacted]w[redacted]d being followed for ongoing prenatal care.  She is currently monitored for the following issues for this low-risk pregnancy and has Supervision of low-risk pregnancy and Late prenatal care affecting pregnancy on their problem list.  Patient reports no complaints. Reports fetal movement. Denies any contractions, bleeding or leaking of fluid.   The following portions of the patient's history were reviewed and updated as appropriate: allergies, current medications, past family history, past medical history, past social history, past surgical history and problem list.   Objective:   General:  Alert, oriented and cooperative.   Mental Status: Normal mood and affect perceived. Normal judgment and thought content.  Rest of physical exam deferred due to type of encounter  Assessment and Plan:  Pregnancy: G2P0010 at [redacted]w[redacted]d 1. Encounter for supervision of low-risk pregnancy in third trimester - patient wants water birth. She has done the class, and will scan and send her class certificate via mychart message. CNM visit done, and info given on water birth today. Patient will need consent at next in person visit or in labor if she does not have an in person visit prior to labor.     Term labor symptoms and general obstetric precautions including but not limited to vaginal bleeding, contractions,  leaking of fluid and fetal movement were reviewed in detail with the patient.  I discussed the assessment and treatment plan with the patient. The patient was provided an opportunity to ask questions and all were answered. The patient agreed with the plan and demonstrated an understanding of the instructions. The patient was advised to call back or seek an in-person office evaluation/go to MAU at [facility] for any urgent or concerning symptoms. Please refer to After Visit Summary for other counseling recommendations.   I provided 12 minutes of non-face-to-face time during this encounter.  Return in about 1 week (around 10/14/2019) for virtual visit .  No future appointments.  Thressa Sheller DNP, CNM  10/07/19  9:40 AM  Center for [employer], [facility]

## 2019-10-07 NOTE — Patient Instructions (Signed)
Considering Waterbirth? Guide for patients at Center for [employer]  Why consider waterbirth?  . Gentle birth for babies . Less pain medicine used in labor . May allow for passive descent/less pushing . May reduce perineal tears  . More mobility and instinctive maternal position changes . Increased maternal relaxation . Reduced blood pressure in labor  Is waterbirth safe? What are the risks of infection, drowning or other complications?  . Infection: o Very low risk (3.7 % for tub vs 4.8% for bed) o 7 in 8000 waterbirths with documented infection o Poorly cleaned equipment most common cause o Slightly lower group B strep transmission rate  . Drowning o Maternal:  - Very low risk   - Related to seizures or fainting o Newborn:  - Very low risk. No evidence of increased risk of respiratory problems in multiple large studies - Physiological protection from breathing under water - Avoid underwater birth if there are any fetal complications - Once baby's head is out of the water, keep it out.  . Birth complication o Some reports of cord trauma, but risk decreased by bringing baby to surface gradually o No evidence of increased risk of shoulder dystocia. Mothers can usually change positions faster in water than in a bed, possibly aiding the maneuvers to free the shoulder.   You must attend a Doren Custard class at [facility]  3rd Wednesday of every month from 7-9pm  [employer] by calling [phone] or online at [email or web address]  Bring [place] the certificate from the class to your prenatal appointment  Meet with a midwife at 36 weeks to see if you can still plan a waterbirth and to sign the consent.   If you plan a waterbirth at [facility] and [facility] at [facility], you will need to purchase the following:  Fish net  Bathing suit top (optional)  Long-handled mirror (optional)  Places to purchase or rent supplies:   GotWebTools.is  for tub purchases and supplies  Waterbirthsolutions.com for tub purchases and supplies  The Labor Ladies (www.[email or web address]) $275 for tub rental/set-up & take down/kit   [employer] (http://www.fleming.[email or web address]) Information regarding doulas (labor support) who provide pool rentals  Things that would prevent you from having a waterbirth:  Premature, <37wks  Previous cesarean birth  Presence of thick meconium-stained fluid  Multiple gestation (Twins, triplets, etc.)  Uncontrolled diabetes or gestational diabetes requiring medication  Hypertension requiring medication or diagnosis of pre-eclampsia  Heavy vaginal bleeding  Non-reassuring fetal heart rate  Active infection (MRSA, etc.). Group B Strep is NOT a contraindication for waterbirth.  If your labor has to be induced and induction method requires continuous monitoring of the baby's heart rate  Other risks/issues identified by your obstetrical provider  Please remember that birth is unpredictable. Under certain unforeseeable circumstances your provider may advise against giving birth in the tub. These decisions will be made on a case-by-case basis and with the safety of you and your baby as our highest priority.  DOULA LIST    Beautiful Beginnings Doula   [place] [phone]                          [email or web address] .[email or web address]   Zula the Maurine Cane [phone]                  [email or web address]   Precious Buffalo Reece Leader  ShoppingLesson.hu   ??THE MOTHERLY DOULA??  Lajuana Ripple   [phone]                 [email or web address] .[email or web address]     The Abundant Life Doula  Mattie Marlin  [phone]                [email or web address] .[email or web address]  [email or web address]               [phone]   [email or web address] .[email or web address]  www.[email or web address]    Lenoria Farrier: [place] [phone]              [email or web address] .com  www.[email or web address]   [place] [phone]   [email or web address] [place], [place]  Llana Aliment              [phone]             [email or web address] .[email or web address]  AffordableShare.[email or web address]   Ease Doula Collaborative   Burney Gauze               [phone]  [email or web address] .[email or web address]  [email or web address]              [phone] [email or web address] .[email or web address] PopularFlicks.co.[email or web address]  Natural Baby Doulas   Almyra Brace         [facility]       Lora Reynolds              [phone] contact'[email or web address] .com  [email or web address]   [facility]  [place]               [phone] Info'@[email or web address]'$ .[email or web address]   [facility] Doula Services  Abbie Sons               [phone] [email or web address] .[email or web address]   [email or web address]   [facility]                [phone] [email or web address] .[email or web address] Facebook and IG '@soleildoula'$ .Vivia Birmingham    [phone] bccooper'[email or web address]'$ .Dola Argyle   [phone] [email or web address]  Delanna Ahmadi                [phone] [email or web address]   [facility]    [phone]  [email or web address]'$ .[email or web address]  IG '@[email or web address]'$  Henrine Screws                 [phone]   [email or web address] .[email or web address]  Rachel Moulds "[place]" Free    [phone] [email or web address]  Mtende Roll                [phone]           [email or web address] .com Susie Williams                                      [email or web address]  Crissie Reese                 [phone]   [email or web address] .[email or web address]    Carlean Jews               [phone]  [email or web address] .[email or web address]  Rigoberto Noel     [phone]   [email or web address] .[email or web address]  [email or web address]  Shayla Rhem               [phone]

## 2019-10-07 NOTE — Progress Notes (Signed)
I connected with  [name] on 10/07/19 at  9:35 AM EDT by telephone and verified that I am speaking with the correct person using two identifiers.   I discussed the limitations, risks, security and privacy concerns of performing an evaluation and management service by telephone and the availability of in person appointments. I also discussed with the patient that there may be a patient responsible charge related to this service. The patient expressed understanding and agreed to proceed.  Janene Madeira Latricia Cerrito, CMA 10/07/2019  9:22 AM

## 2019-10-12 ENCOUNTER — Inpatient Hospital Stay (HOSPITAL_BASED_OUTPATIENT_CLINIC_OR_DEPARTMENT_OTHER): Payer: BC Managed Care – PPO

## 2019-10-12 ENCOUNTER — Encounter (HOSPITAL_COMMUNITY): Payer: Self-pay | Admitting: Obstetrics and Gynecology

## 2019-10-12 ENCOUNTER — Encounter (HOSPITAL_COMMUNITY): Payer: Self-pay | Admitting: Obstetrics & Gynecology

## 2019-10-12 ENCOUNTER — Inpatient Hospital Stay (HOSPITAL_COMMUNITY)
Admission: AD | Admit: 2019-10-12 | Discharge: 2019-10-15 | DRG: 807 | Disposition: A | Discharge status: Home / Self Care | Payer: BC Managed Care – PPO | Attending: Obstetrics & Gynecology | Admitting: Obstetrics & Gynecology

## 2019-10-12 ENCOUNTER — Other Ambulatory Visit: Payer: Self-pay

## 2019-10-12 ENCOUNTER — Inpatient Hospital Stay (EMERGENCY_DEPARTMENT_HOSPITAL)
Admission: AD | Admit: 2019-10-12 | Discharge: 2019-10-12 | Disposition: A | Discharge status: Home / Self Care | Payer: BC Managed Care – PPO | Source: Intra-hospital | Attending: Obstetrics & Gynecology | Admitting: Obstetrics & Gynecology

## 2019-10-12 DIAGNOSIS — O0933 Supervision of pregnancy with insufficient antenatal care, third trimester: Secondary | ICD-10-CM | POA: Diagnosis not present

## 2019-10-12 DIAGNOSIS — O368130 Decreased fetal movements, third trimester, not applicable or unspecified: Secondary | ICD-10-CM

## 2019-10-12 DIAGNOSIS — Z3493 Encounter for supervision of normal pregnancy, unspecified, third trimester: Secondary | ICD-10-CM

## 2019-10-12 DIAGNOSIS — O365930 Maternal care for other known or suspected poor fetal growth, third trimester, not applicable or unspecified: Secondary | ICD-10-CM | POA: Diagnosis not present

## 2019-10-12 DIAGNOSIS — O403XX0 Polyhydramnios, third trimester, not applicable or unspecified: Secondary | ICD-10-CM | POA: Diagnosis not present

## 2019-10-12 DIAGNOSIS — Z87891 Personal history of nicotine dependence: Secondary | ICD-10-CM

## 2019-10-12 DIAGNOSIS — O26893 Other specified pregnancy related conditions, third trimester: Secondary | ICD-10-CM | POA: Diagnosis not present

## 2019-10-12 DIAGNOSIS — Z20822 Contact with and (suspected) exposure to covid-19: Secondary | ICD-10-CM | POA: Diagnosis present

## 2019-10-12 DIAGNOSIS — O288 Other abnormal findings on antenatal screening of mother: Secondary | ICD-10-CM | POA: Diagnosis not present

## 2019-10-12 DIAGNOSIS — Z3A38 38 weeks gestation of pregnancy: Secondary | ICD-10-CM | POA: Diagnosis not present

## 2019-10-12 DIAGNOSIS — Z3689 Encounter for other specified antenatal screening: Secondary | ICD-10-CM

## 2019-10-12 DIAGNOSIS — O0930 Supervision of pregnancy with insufficient antenatal care, unspecified trimester: Secondary | ICD-10-CM

## 2019-10-12 DIAGNOSIS — R109 Unspecified abdominal pain: Secondary | ICD-10-CM

## 2019-10-12 DIAGNOSIS — Z3A37 37 weeks gestation of pregnancy: Secondary | ICD-10-CM | POA: Diagnosis not present

## 2019-10-12 LAB — URINALYSIS, ROUTINE W REFLEX MICROSCOPIC
Bilirubin Urine: NEGATIVE
Glucose, UA: NEGATIVE mg/dL
Hgb urine dipstick: NEGATIVE
Ketones, ur: NEGATIVE mg/dL
Nitrite: NEGATIVE
Protein, ur: NEGATIVE mg/dL
Specific Gravity, Urine: 1.016 (ref 1.005–1.030)
pH: 6 (ref 5.0–8.0)

## 2019-10-12 LAB — CBC
HCT: 40.2 % (ref 36.0–46.0)
Hemoglobin: 13.3 g/dL (ref 12.0–15.0)
MCH: 32.4 pg (ref 26.0–34.0)
MCHC: 33.1 g/dL (ref 30.0–36.0)
MCV: 97.8 fL (ref 80.0–100.0)
Platelets: 144 10*3/uL — ABNORMAL LOW (ref 150–400)
RBC: 4.11 MIL/uL (ref 3.87–5.11)
RDW: 13 % (ref 11.5–15.5)
WBC: 11.8 10*3/uL — ABNORMAL HIGH (ref 4.0–10.5)
nRBC: 0 % (ref 0.0–0.2)

## 2019-10-12 LAB — POCT FERN TEST: POCT Fern Test: POSITIVE

## 2019-10-12 MED ORDER — OXYTOCIN 40 UNITS IN NORMAL SALINE INFUSION - SIMPLE MED: 2.5000 [IU]/h | INTRAVENOUS | Status: DC

## 2019-10-12 MED ORDER — ONDANSETRON HCL 4 MG/2ML IJ SOLN: 4.0000 mg | Freq: Four times a day (QID) | INTRAMUSCULAR | Status: DC | PRN

## 2019-10-12 MED ORDER — SOD CITRATE-CITRIC ACID 500-334 MG/5ML PO SOLN: 30.0000 mL | ORAL | Status: DC | PRN

## 2019-10-12 MED ORDER — ACETAMINOPHEN 325 MG PO TABS: 650.0000 mg | ORAL_TABLET | ORAL | Status: DC | PRN

## 2019-10-12 MED ORDER — LACTATED RINGERS IV SOLN: INTRAVENOUS | Status: DC

## 2019-10-12 MED ORDER — LACTATED RINGERS IV SOLN: 500.0000 mL | INTRAVENOUS | Status: DC | PRN

## 2019-10-12 MED ORDER — CYCLOBENZAPRINE HCL 5 MG PO TABS
10.0000 mg | ORAL_TABLET | Freq: Once | ORAL | Status: AC
  Administered 2019-10-12: 10 mg via ORAL
  Filled 2019-10-12: qty 2

## 2019-10-12 MED ORDER — LIDOCAINE HCL (PF) 1 % IJ SOLN
30.0000 mL | INTRAMUSCULAR | Status: AC | PRN
  Administered 2019-10-13: 01:00:00 30 mL via SUBCUTANEOUS
  Filled 2019-10-12: qty 30

## 2019-10-12 MED ORDER — OXYTOCIN BOLUS FROM INFUSION: 500.0000 mL | Freq: Once | INTRAVENOUS | Status: DC

## 2019-10-12 NOTE — H&P (Signed)
OBSTETRIC ADMISSION HISTORY AND PHYSICAL  Laurie Woodard is a 24 y.o. female G2P0010 with IUP at 55w4dby 10 wk presenting for ROM at 2230. Reports no FM since 1000. She reports no VB, no blurry vision, headaches or peripheral edema, and RUQ pain.  She plans on breast feeding. She requests POPs for birth control. [facility]  Dating: By 10 wk [place]--->  Estimated Date of Delivery: 10/22/19  Sono:  3/17  '@[redacted]w[redacted]d'$ , CWD, normal anatomy, cephalic presentation, anterior placental lie, 2711g, 23% EFW  Prenatal History/Complications: Prenatal care starting at 274w6dPast Medical History: Past Medical History:  Diagnosis Date  . Medical history non-contributory     Past Surgical History: Past Surgical History:  Procedure Laterality Date  . FINGER SURGERY  2005    Obstetrical History: OB History    Gravida  2   Para      Term      Preterm      AB  1   Living        SAB      TAB  1   Ectopic      Multiple      Live Births              Social History: Social History   Socioeconomic History  . Marital status: Single    Spouse name: Not on file  . Number of children: Not on file  . Years of education: Not on file  . Highest education level: Not on file  Occupational History  . Not on file  Tobacco Use  . Smoking status: Former Smoker    Quit date: 11/01/2018    Years since quitting: 0.9  . Smokeless tobacco: Never Used  Substance and Sexual Activity  . Alcohol use: Not Currently    Comment: occasional   . Drug use: Never  . Sexual activity: Yes    Birth control/protection: None  Other Topics Concern  . Not on file  Social History Narrative  . Not on file   Social Determinants of Health   Financial Resource Strain:   . Difficulty of Paying Living Expenses:   Food Insecurity: No Food Insecurity  . Worried About RuCharity fundraisern the Last Year: Never true  . Ran Out of Food in the Last Year: Never true  Transportation  Needs: No Transportation Needs  . Lack of Transportation (Medical): No  . Lack of Transportation (Non-Medical): No  Physical Activity:   . Days of Exercise per Week:   . Minutes of Exercise per Session:   Stress:   . Feeling of Stress :   Social Connections:   . Frequency of Communication with Friends and Family:   . Frequency of Social Gatherings with Friends and Family:   . Attends Religious Services:   . Active Member of Clubs or Organizations:   . Attends ClArchivisteetings:   . Marland Kitchenarital Status:     Family History: Family History  Problem Relation Age of Onset  . Hypertension Mother   . Depression Mother   . Kidney disease Mother   . Diabetes Father     Allergies: No Known Allergies  Medications Prior to Admission  Medication Sig Dispense Refill Last Dose  . prenatal vitamin w/FE, FA (NATACHEW) 29-1 MG CHEW chewable tablet Chew 1 tablet by mouth daily at 12 noon. 30 tablet 12 10/12/2019 at Unknown time  . Blood Pressure Monitoring (BLOOD PRESSURE KIT) DEVI  1 Device by Does not apply route as needed. 1 each 0 Unknown at Unknown time     Review of Systems   All systems reviewed and negative except as stated in HPI  Last menstrual period 01/29/2019. General appearance: alert, cooperative and mild distress Lungs: normal effort Heart: regular rate  Abdomen: soft, non-tender; bowel sounds normal Pelvic: gravid uterus Extremities: Homans sign is negative, no sign of DVT Presentation: cephalic Fetal monitoringBaseline: 145 bpm, Variability: Good {> 6 bpm), Accelerations: Reactive and Decelerations: Absent Uterine activity: Frequency: Every 2-3 minutes     Prenatal labs: ABO, Rh: O/Positive/-- (12/22 1155) Antibody: Negative (12/22 1155) Rubella: 2.21 (12/22 1155) RPR: Non Reactive (01/19 0843)  HBsAg: Negative (12/22 1155)  HIV: Non Reactive (01/19 0843)  GBS: Negative/-- (03/16 1408)  2 hr Glucola WNL Genetic screening  Low risk Anatomy US  WNL  Prenatal Transfer Tool  Maternal Diabetes: No Genetic Screening: Normal Maternal Ultrasounds/Referrals: Normal Fetal Ultrasounds or other Referrals:  None Maternal Substance Abuse:  No Significant Maternal Medications:  None Significant Maternal Lab Results: Group B Strep negative  Results for orders placed or performed during the hospital encounter of 10/12/19 (from the past 24 hour(s))  POCT fern test   Collection Time: 10/12/19 10:58 PM  Result Value Ref Range   POCT Fern Test Positive = ruptured amniotic membanes   Results for orders placed or performed during the hospital encounter of 10/12/19 (from the past 24 hour(s))  Urinalysis, Routine w reflex microscopic   Collection Time: 10/12/19  5:20 AM  Result Value Ref Range   Color, Urine YELLOW YELLOW   APPearance HAZY (A) CLEAR   Specific Gravity, Urine 1.016 1.005 - 1.030   pH 6.0 5.0 - 8.0   Glucose, UA NEGATIVE NEGATIVE mg/dL   Hgb urine dipstick NEGATIVE NEGATIVE   Bilirubin Urine NEGATIVE NEGATIVE   Ketones, ur NEGATIVE NEGATIVE mg/dL   Protein, ur NEGATIVE NEGATIVE mg/dL   Nitrite NEGATIVE NEGATIVE   Leukocytes,Ua SMALL (A) NEGATIVE   RBC / HPF 0-5 0 - 5 RBC/hpf   WBC, UA 11-20 0 - 5 WBC/hpf   Bacteria, UA MANY (A) NONE SEEN   Squamous Epithelial / LPF 0-5 0 - 5   Mucus PRESENT     Patient Active Problem List   Diagnosis Date Noted  . Supervision of low-risk pregnancy 07/03/2019  . Late prenatal care affecting pregnancy 07/03/2019    Assessment/Plan:  Laurie Woodard is a 24 y.o. G2P0010 at 18w4dhere for SROM 2230.  #Labor: Exceptant management. Desires water birth, consent signed. Saline lock and intermittent monitoring ordered. Waiting for COVID swab and more active stage of labor to get in tub. Anticipate SVD.  #Pain: Planning waterbirth, discussed pain management options at length #FWB: Cat I; EFW: 3100g #ID:  GBS neg #MOF: Breast #MOC: POPs #Circ:  Inpatient   CWende Woodard  CNM 10/12/19 11:24 PM

## 2019-10-12 NOTE — MAU Note (Signed)
Water Birth consents signed with Antony Odea CNM.

## 2019-10-12 NOTE — MAU Note (Signed)
Right sided abd pain that radiates to mid to low back on right side - sometimes over to left side of back since 9am march 27th.  No bleeding. No leaking. Baby not moving like usual, slight movement for 1-2 days.

## 2019-10-12 NOTE — Discharge Instructions (Signed)

## 2019-10-12 NOTE — MAU Note (Addendum)
Patient reports to MAU c/o LOF that started @2230  /clear fluid. No FM since 1000am. Pt reports some ctx as well that she has not timed.

## 2019-10-12 NOTE — MAU Provider Note (Signed)
History     CSN: [id number]  Arrival date and time: 10/12/19 0457   First Provider Initiated Contact with Patient 10/12/19 0544      Chief Complaint  Patient presents with  . Abdominal Pain   Laurie Woodard is a 24 y.o. G2P0010 at 39w4dwho [facility]  She presents today for Abdominal Pain.  She states the pain started on Saturday around 9 or 10am.  She states she is having a "sharp" pain located on her right side.  She states the pain is constant and radiates from the front of her abdomen to the back.  Patient states she has tried stretching without relief of her symptoms and notes that sitting or laying on her back worsens the symptoms.  Patient reports decreased fetal movement for the past 2 days and denies abdominal cramping or contractions.  Patient further denies vaginal discharge or bleeding and states she has "leaking when I am drinking like the fluid is going right through me."  However, patient denies leaking at other times outside of drinking.  Patient rates her pain a 5-6/10.       OB History    Gravida  2   Para      Term      Preterm      AB  1   Living        SAB      TAB  1   Ectopic      Multiple      Live Births              Past Medical History:  Diagnosis Date  . Medical history non-contributory     Past Surgical History:  Procedure Laterality Date  . FINGER SURGERY  2005    Family History  Problem Relation Age of Onset  . Hypertension Mother   . Depression Mother   . Kidney disease Mother   . Diabetes Father     Social History   Tobacco Use  . Smoking status: Former Smoker    Quit date: 11/01/2018    Years since quitting: 0.9  . Smokeless tobacco: Never Used  Substance Use Topics  . Alcohol use: Not Currently    Comment: occasional   . Drug use: Never    Allergies: No Known Allergies  Medications Prior to Admission  Medication Sig Dispense Refill Last Dose  . prenatal vitamin w/FE, FA (NATACHEW) 29-1  MG CHEW chewable tablet Chew 1 tablet by mouth daily at 12 noon. 30 tablet 12 10/11/2019 at Unknown time  . Blood Pressure Monitoring (BLOOD PRESSURE KIT) DEVI 1 Device by Does not apply route as needed. 1 each 0     Review of Systems  Constitutional: Negative for chills and fever.  Respiratory: Negative for cough and shortness of breath.   Gastrointestinal: Positive for abdominal pain. Negative for constipation, diarrhea, nausea and vomiting.  Genitourinary: Negative for difficulty urinating, dysuria, vaginal bleeding and vaginal discharge.  Musculoskeletal: Positive for back pain (Lower back, intermittent. ).  Neurological: Negative for dizziness, light-headedness and headaches.   Physical Exam   Blood pressure 122/74, pulse 70, temperature 97.9 F (36.6 C), temperature source Oral, resp. rate 16, height 5' 5.5" (1.664 m), weight 76.7 kg, last menstrual period 01/29/2019, SpO2 100 %.  Physical Exam  Constitutional: She is oriented to person, place, and time. She appears well-developed and well-nourished. No distress.  HENT:  Head: Normocephalic and atraumatic.  Eyes: Conjunctivae are normal.  Cardiovascular: Normal rate.  Respiratory: Effort normal and breath sounds normal.  GI: Soft. There is abdominal tenderness in the right upper quadrant. There is no rigidity and no guarding.    Gravid--fundal height appears AGA, Soft,    Musculoskeletal:        General: Normal range of motion.     Cervical back: Normal range of motion.  Neurological: She is alert and oriented to person, place, and time.  Skin: Skin is warm and dry.  Psychiatric: She has a normal mood and affect. Her behavior is normal.    Fetal Assessment 130 bpm, Mod Var, -Decels, +10x10Accels Toco: Q3-34mn  MAU Course   Results for orders placed or performed during the hospital encounter of 10/12/19 (from the past 24 hour(s))  Urinalysis, Routine w reflex microscopic     Status: Abnormal   Collection Time:  10/12/19  5:20 AM  Result Value Ref Range   Color, Urine YELLOW YELLOW   APPearance HAZY (A) CLEAR   Specific Gravity, Urine 1.016 1.005 - 1.030   pH 6.0 5.0 - 8.0   Glucose, UA NEGATIVE NEGATIVE mg/dL   Hgb urine dipstick NEGATIVE NEGATIVE   Bilirubin Urine NEGATIVE NEGATIVE   Ketones, ur NEGATIVE NEGATIVE mg/dL   Protein, ur NEGATIVE NEGATIVE mg/dL   Nitrite NEGATIVE NEGATIVE   Leukocytes,Ua SMALL (A) NEGATIVE   RBC / HPF 0-5 0 - 5 RBC/hpf   WBC, UA 11-20 0 - 5 WBC/hpf   Bacteria, UA MANY (A) NONE SEEN   Squamous Epithelial / LPF 0-5 0 - 5   Mucus PRESENT         MDM PE Labs:UA EFM Muscle Relaxant Assessment and Plan  24year old G2P0010  SIUP at 3444weeks Cat I FT Abdominal Pain  -POC reviewed. -Exam performed and findings discussed. -Patient offered and accepts pain medication. -Given flexeril and heating pad applied to affected area. -NST reassuring, but not yet reactive.  -Will continue to monitor and reassess.  JMaryann ConnersMSN, CNM 10/12/2019, 5:44 AM   Reassessment (7:06 AM) BPP 8/8 Mild Polyhydramnios (AFI 25.68) DFM  -Patient reports pain is improving with flexeril dosing and is now a 5/10. -Patient also reports increased perception of fetal movement since drinking juice.  -Discussed UKoreafindings including increased fluid level. -Informed that fluid is not contributing to abdominal pain.  -UA returns with many bacteria and sent for culture. -Patient instructed to keep appt that is scheduled for Tuesday, but return to hospital for perception of DFM, contractions, and/or vaginal bleeding. -Patient denies questions or concerns. -Encouraged to call or return to MAU if symptoms worsen or with the onset of new symptoms. -Discharged to home in stable condition.  JMaryann ConnersMSN, CNM Advanced Practice Provider, Center for [facility]

## 2019-10-13 ENCOUNTER — Encounter (HOSPITAL_COMMUNITY): Payer: Self-pay | Admitting: Obstetrics & Gynecology

## 2019-10-13 ENCOUNTER — Encounter: Payer: Self-pay | Admitting: *Deleted

## 2019-10-13 DIAGNOSIS — Z3A38 38 weeks gestation of pregnancy: Secondary | ICD-10-CM

## 2019-10-13 LAB — CBC
HCT: 37.6 % (ref 36.0–46.0)
Hemoglobin: 12.5 g/dL (ref 12.0–15.0)
MCH: 32.1 pg (ref 26.0–34.0)
MCHC: 33.2 g/dL (ref 30.0–36.0)
MCV: 96.7 fL (ref 80.0–100.0)
Platelets: 137 10*3/uL — ABNORMAL LOW (ref 150–400)
RBC: 3.89 MIL/uL (ref 3.87–5.11)
RDW: 12.8 % (ref 11.5–15.5)
WBC: 18.6 10*3/uL — ABNORMAL HIGH (ref 4.0–10.5)
nRBC: 0 % (ref 0.0–0.2)

## 2019-10-13 LAB — ABO/RH: ABO/RH(D): O POS

## 2019-10-13 LAB — RESPIRATORY PANEL BY RT PCR (FLU A&B, COVID)
Influenza A by PCR: NEGATIVE
Influenza B by PCR: NEGATIVE
SARS Coronavirus 2 by RT PCR: NEGATIVE

## 2019-10-13 LAB — CULTURE, OB URINE: Culture: <10000 — AB

## 2019-10-13 LAB — TYPE AND SCREEN
ABO/RH(D): O POS
Antibody Screen: NEGATIVE

## 2019-10-13 LAB — RPR: RPR Ser Ql: NONREACTIVE

## 2019-10-13 MED ORDER — WITCH HAZEL-GLYCERIN EX PADS: 1.0000 "application " | MEDICATED_PAD | CUTANEOUS | Status: DC | PRN

## 2019-10-13 MED ORDER — COCONUT OIL OIL: 1.0000 "application " | TOPICAL_OIL | Status: DC | PRN

## 2019-10-13 MED ORDER — IBUPROFEN 600 MG PO TABS
600.0000 mg | ORAL_TABLET | Freq: Four times a day (QID) | ORAL | Status: DC
  Administered 2019-10-13 – 2019-10-15 (×9): 600 mg via ORAL
  Filled 2019-10-13 (×10): qty 1

## 2019-10-13 MED ORDER — TETANUS-DIPHTH-ACELL PERTUSSIS 5-2.5-18.5 LF-MCG/0.5 IM SUSP: 0.5000 mL | Freq: Once | INTRAMUSCULAR | Status: DC

## 2019-10-13 MED ORDER — ACETAMINOPHEN 325 MG PO TABS: 650.0000 mg | ORAL_TABLET | ORAL | Status: DC | PRN

## 2019-10-13 MED ORDER — ONDANSETRON HCL 4 MG/2ML IJ SOLN: 4.0000 mg | INTRAMUSCULAR | Status: DC | PRN

## 2019-10-13 MED ORDER — ZOLPIDEM TARTRATE 5 MG PO TABS: 5.0000 mg | ORAL_TABLET | Freq: Every evening | ORAL | Status: DC | PRN

## 2019-10-13 MED ORDER — DIBUCAINE (PERIANAL) 1 % EX OINT: 1.0000 "application " | TOPICAL_OINTMENT | CUTANEOUS | Status: DC | PRN

## 2019-10-13 MED ORDER — ONDANSETRON HCL 4 MG PO TABS: 4.0000 mg | ORAL_TABLET | ORAL | Status: DC | PRN

## 2019-10-13 MED ORDER — BENZOCAINE-MENTHOL 20-0.5 % EX AERO: 1.0000 "application " | INHALATION_SPRAY | CUTANEOUS | Status: DC | PRN

## 2019-10-13 MED ORDER — PRENATAL MULTIVITAMIN CH
1.0000 | ORAL_TABLET | Freq: Every day | ORAL | Status: DC
  Administered 2019-10-13 – 2019-10-15 (×3): 1 via ORAL
  Filled 2019-10-13 (×3): qty 1

## 2019-10-13 MED ORDER — DIPHENHYDRAMINE HCL 25 MG PO CAPS: 25.0000 mg | ORAL_CAPSULE | Freq: Four times a day (QID) | ORAL | Status: DC | PRN

## 2019-10-13 MED ORDER — SIMETHICONE 80 MG PO CHEW: 80.0000 mg | CHEWABLE_TABLET | ORAL | Status: DC | PRN

## 2019-10-13 MED ORDER — SENNOSIDES-DOCUSATE SODIUM 8.6-50 MG PO TABS
2.0000 | ORAL_TABLET | ORAL | Status: DC
  Administered 2019-10-14: 2 via ORAL
  Filled 2019-10-13 (×2): qty 2

## 2019-10-13 MED ORDER — OXYTOCIN 10 UNIT/ML IJ SOLN: 10.0000 [IU] | Freq: Once | INTRAMUSCULAR | Status: DC

## 2019-10-13 MED ORDER — OXYTOCIN 10 UNIT/ML IJ SOLN
INTRAMUSCULAR | Status: AC
  Administered 2019-10-13: 01:00:00 10 [IU]
  Filled 2019-10-13: qty 1

## 2019-10-13 NOTE — Lactation Note (Signed)
This note was copied from a baby's chart. Lactation Consultation Note  Patient Name: Laurie Woodard MBOMQ'T Date: 10/13/2019 Reason for consult: Initial assessment;Primapara;1st time breastfeeding;Early term 32-38.6wks  Baby is 9 hours old. He has been to the breast twice and mother reports that it is going well.  Baby is currently at nursery due to low temperature. Mother was taught hand expression and manual pump use to express colostrum for baby.  About 2 mL of expressed colostrum were finger-fed at nursery.  Instructed mother to feed following baby's hunger cues. Encouraged to contact lactation as needed for questions or assistance.     Maternal Data Has patient been taught Hand Expression?: Yes Does the patient have breastfeeding experience prior to this delivery?: No  Feeding Feeding Type: Breast Milk  LATCH Score Latch: Repeated attempts needed to sustain latch, nipple held in mouth throughout feeding, stimulation needed to elicit sucking reflex.  Audible Swallowing: Spontaneous and intermittent  Type of Nipple: Everted at rest and after stimulation  Comfort (Breast/Nipple): Soft / non-tender  Hold (Positioning): Assistance needed to correctly position infant at breast and maintain latch.  LATCH Score: 8  Interventions Interventions: Hand pump;Hand express;Breast massage  Lactation Tools Discussed/Used Initiated by:: Jameir Ake Higuera Date initiated:: 10/13/19   Consult Status Consult Status: Follow-up Date: 10/14/19 Follow-up type: In-patient    Anyi Fels A Higuera Ancidey 10/13/2019, 9:48 AM

## 2019-10-13 NOTE — Discharge Summary (Addendum)
Postpartum Discharge Summary    Patient Name: Laurie Woodard DOB: Sep 13, 1995 MRN: [id number]  Date of admission: 10/12/2019 Delivering Provider: Wende Mott   Date of discharge: 10/15/2019  Admitting diagnosis: Labor and delivery, indication for care [O75.9] Intrauterine pregnancy: [redacted]w[redacted]d    Secondary diagnosis:  Active Problems:   Late prenatal care affecting pregnancy   Labor and delivery, indication for care   [redacted] weeks gestation of pregnancy  Additional problems: None     Discharge diagnosis: Term Pregnancy Delivered                                                                                                Post partum procedures:none  Augmentation: none  Complications: None  Hospital course:  Onset of Labor With Vaginal Delivery     24y.o. yo G2P0010 at 345w5das admitted in Active Labor on 10/12/2019. Patient had an uncomplicated labor course as follows: admitted at 3cm, rapidly progressed to complete and had a successful waterbirth. Membrane Rupture Time/Date: 10:30 PM ,10/12/2019   Intrapartum Procedures: Episiotomy: None [1]                                         Lacerations:  1st degree [2]  Patient had a delivery of a Viable infant. 10/13/2019  Information for the patient's newborn:  MaCiani, Rutten0[[id number]]Delivery Method: VaSpiveyad an uncomplicated postpartum course.  She is ambulating, tolerating a regular diet, passing flatus, and urinating well. Patient is discharged home in stable condition on 10/15/19.  Delivery time: 12:28 AM    Magnesium Sulfate received: No BMZ received: No Rhophylac:N/A MMR:N/A Transfusion:No  Physical exam  Vitals:   10/14/19 0548 10/14/19 1425 10/14/19 2111 10/15/19 0555  BP: 110/73 109/71 119/63 115/66  Pulse: (!) 57 70 64 (!) 58  Resp: _0 Temp: 98.2 F (36.8 C) 98 F (36.7 C) 98.2 F (36.8 C) 98.2 F (36.8 C)  TempSrc: Oral Oral Oral Oral  SpO2:   100% 100%   General: alert,  cooperative and no distress Lochia: appropriate Uterine Fundus: firm Incision: N/A DVT Evaluation: No evidence of DVT seen on physical exam. Labs: Lab Results  Component Value Date   WBC 18.6 (H) 10/13/2019   HGB 12.5 10/13/2019   HCT 37.6 10/13/2019   MCV 96.7 10/13/2019   PLT 137 (L) 10/13/2019   No flowsheet data found. Edinburgh Score: Edinburgh Postnatal Depression Scale Screening Tool 10/13/2019  I have been able to laugh and see the funny side of things. 0  I have looked forward with enjoyment to things. 0  I have blamed myself unnecessarily when things went wrong. 2  I have been anxious or worried for no good reason. 2  I have felt scared or panicky for no good reason. 1  Things have been getting on top of me. 1  I have been so unhappy that I have had difficulty sleeping. 0  I have felt  sad or miserable. 1  I have been so unhappy that I have been crying. 1  The thought of harming myself has occurred to me. 0  Edinburgh Postnatal Depression Scale Total 8    Discharge instruction: per After Visit Summary and "Baby and Me Booklet".  After visit meds:  Allergies as of 10/15/2019   No Known Allergies     Medication List    STOP taking these medications   Blood Pressure Kit Devi     TAKE these medications   acetaminophen 325 MG tablet Commonly known as: Tylenol Take 2 tablets (650 mg total) by mouth every 4 (four) hours as needed (for pain scale < 4).   ibuprofen 600 MG tablet Commonly known as: ADVIL Take 1 tablet (600 mg total) by mouth every 6 (six) hours.   norethindrone 0.35 MG tablet Commonly known as: Ortho Micronor Take 1 tablet (0.35 mg total) by mouth daily.   prenatal vitamin w/FE, FA 29-1 MG Chew chewable tablet Chew 1 tablet by mouth daily at 12 noon.       Diet: routine diet  Activity: Advance as tolerated. Pelvic rest for 6 weeks.   Outpatient follow up:4 weeks Follow up Appt: Future Appointments  Date Time Provider [place]   11/17/2019 10:15 AM Starr Lake, CNM [facility]   Follow up Visit:  Please schedule this patient for Postpartum visit in: 4 weeks with the following provider: Any provider Virtual For C/S patients schedule nurse incision check in weeks 2 weeks: no Low risk pregnancy complicated by: n/a Delivery mode:  Waterbirth Anticipated Birth Control:  POPs PP Procedures needed: n/a  Schedule Integrated BH visit: no    Newborn Data: Live born female  Birth Weight: 2991g  APGAR: 58, 9  Newborn Delivery   Birth date/time: 10/13/2019 00:28:00 Delivery type: Vaginal, Spontaneous      Baby Feeding: Breast Disposition:home with mother   Attestation of Supervision of Student:  I confirm that I have verified the information documented in the resident's note and that I have also personally reperformed the history, physical exam and all medical decision making activities.  I have verified that all services and findings are accurately documented in this student's note; and I agree with management and plan as outlined in the documentation. I have also made any necessary editorial changes.  Marcille Buffy DNP, CNM  10/15/19  10:07 AM  Center for [facility]

## 2019-10-14 ENCOUNTER — Telehealth: Payer: BC Managed Care – PPO | Admitting: Medical

## 2019-10-14 NOTE — Lactation Note (Signed)
This note was copied from a baby's chart. Lactation Consultation Note  Patient Name: Laurie Woodard NOBSJ'G Date: 10/14/2019 Reason for consult: Follow-up assessment Baby is 33 hours old/5% weight loss.  Mom reports that baby is breastfeeding well.  She did supplement with donor milk once this morning because baby was fussy.  Mom recently pumped about 20 mls.  She states she wanted to supplement with her milk if possible.  Baby is currently in the nursery for circumcision.  Discussed milk coming to volume and the prevention and treatment of engorgement.  Questions answered.  Reviewed outpatient services and encouraged to call prn.  Maternal Data    Feeding Feeding Type: Donor Breast Milk Nipple Type: Slow - flow  LATCH Score Latch: Grasps breast easily, tongue down, lips flanged, rhythmical sucking.  Audible Swallowing: A few with stimulation  Type of Nipple: Everted at rest and after stimulation  Comfort (Breast/Nipple): Soft / non-tender  Hold (Positioning): Assistance needed to correctly position infant at breast and maintain latch.  LATCH Score: 8  Interventions    Lactation Tools Discussed/Used     Consult Status Consult Status: Complete Follow-up type: Call as needed    Huston Foley 10/14/2019, 10:02 AM

## 2019-10-14 NOTE — Progress Notes (Signed)
Post Partum Day 1 Subjective: Patient reports feeling well. She is tolerating PO. Ambulating and urinating without difficulty. Lochia minimal.  Objective: Blood pressure 115/66, pulse 74, temperature 98.4 F (36.9 C), temperature source Axillary, resp. rate 18, last menstrual period 01/29/2019, SpO2 100 %, unknown if currently breastfeeding.  Physical Exam:  General: alert, cooperative and appears stated age Lochia: appropriate Uterine Fundus: firm Incision: NA DVT Evaluation: No evidence of DVT seen on physical exam.  Recent Labs    10/12/19 2308 10/13/19 0529  HGB 13.3 12.5  HCT 40.2 37.6    Assessment/Plan: Plan for discharge tomorrow. Okay to discharge today if baby can; RN to page team for orders. POPs on discharge Vitals stable Breastfeeding Plan for circ later today    LOS: 2 days   Emmory Solivan N Emmaus Brandi 10/14/2019, 4:16 AM

## 2019-10-15 MED ORDER — NORETHINDRONE 0.35 MG PO TABS: 1.0000 | ORAL_TABLET | Freq: Every day | ORAL | 11 refills | Status: DC

## 2019-10-15 MED ORDER — ACETAMINOPHEN 325 MG PO TABS: 650.0000 mg | ORAL_TABLET | ORAL | 0 refills | Status: AC | PRN

## 2019-10-15 MED ORDER — DOCUSATE SODIUM 100 MG PO CAPS: 100.0000 mg | ORAL_CAPSULE | Freq: Two times a day (BID) | ORAL | 0 refills | Status: DC

## 2019-10-15 MED ORDER — IBUPROFEN 600 MG PO TABS: 600.0000 mg | ORAL_TABLET | Freq: Four times a day (QID) | ORAL | 0 refills | Status: AC

## 2019-10-15 NOTE — Lactation Note (Signed)
This note was copied from a baby's chart. Lactation Consultation Note  Patient Name: Laurie Woodard EYCXK'G Date: 10/15/2019 Reason for consult: Follow-up assessment;Infant weight loss;Other (Comment)(3 % weight loss/ milk in) Baby is 56 hours old At 53 hours bili - 7.9  Mom pumping as LC entered the room and milk is in and mom pumped off full bottles  Dad fed the baby EBM in a bottle 27 ml and mom finished pumping .  Per mom plans to breast feed mots of the time and also have the baby get a bottle to know the baby can feed from breast and bottle.  Mom mentioned she is having some nipple tenderness and knows to change flange if needed  , sore nipple and engorgement prevention and tx  Mom has the hand pomp , DEBP set up and is aware of how to use it.  LC praised mom for her milk already being in. And stressed the importance of giving the baby practice with latching . Stimulation to breast 8-12 times in 24 hours.  Per mom  has a DEBP Medela at home.  Mom aware of LC resources after D/C.  Maternal Data    Feeding Feeding Type: Breast Milk Nipple Type: Slow - flow  LATCH Score Latch: (mom pumping and dad fed the bottle)                 Interventions Interventions: Breast feeding basics reviewed  Lactation Tools Discussed/Used Pump Review: Milk Storage   Consult Status Consult Status: Complete Date: 10/15/19    Kathrin Greathouse 10/15/2019, 9:13 AM

## 2019-10-15 NOTE — Discharge Instructions (Signed)

## 2019-10-21 ENCOUNTER — Telehealth: Payer: BC Managed Care – PPO

## 2019-10-30 ENCOUNTER — Other Ambulatory Visit: Payer: Self-pay | Admitting: Advanced Practice Midwife

## 2019-10-30 ENCOUNTER — Other Ambulatory Visit (INDEPENDENT_AMBULATORY_CARE_PROVIDER_SITE_OTHER): Payer: Self-pay

## 2019-11-05 MED ORDER — DOCUSATE SODIUM 100 MG PO CAPS: 100.0000 mg | ORAL_CAPSULE | Freq: Two times a day (BID) | ORAL | 0 refills | Status: AC

## 2019-11-17 ENCOUNTER — Encounter: Payer: Self-pay | Admitting: Student

## 2019-11-17 ENCOUNTER — Telehealth (INDEPENDENT_AMBULATORY_CARE_PROVIDER_SITE_OTHER): Payer: BC Managed Care – PPO | Admitting: Student

## 2019-11-17 DIAGNOSIS — Z1332 Encounter for screening for maternal depression: Secondary | ICD-10-CM

## 2019-11-17 MED ORDER — NORETHIN ACE-ETH ESTRAD-FE 1-20 MG-MCG(24) PO TABS: 1.0000 | ORAL_TABLET | Freq: Every day | ORAL | 11 refills | Status: AC

## 2019-11-17 NOTE — Progress Notes (Signed)
Patient ID: Laurie Woodard, female   DOB: [birth year], 24 y.o.   MRN: [id number]    TELEHEALTH VIRTUAL POSTPARTUM VISIT ENCOUNTER NOTE  I connected with@ on 11/17/19 at 10:15 AM EDT by telephone at home and verified that I am speaking with the correct person using two identifiers.   I discussed the limitations, risks, security and privacy concerns of performing an evaluation and management service by telephone and the availability of in person appointments. I also discussed with the patient that there may be a patient responsible charge related to this service. The patient expressed understanding and agreed to proceed.  Appointment Date: 11/17/2019  [facility]  Chief Complaint:  Postpartum Visit  History of Present Illness: Laurie Woodard is a 24 y.o. African-American G2P1011 (Patient's last menstrual period was 01/29/2019.), seen for the above chief complaint. Her past medical history is significant for nothing.   She is s/p normal spontaneous vaginal delivery on 10/12/19 at 38  weeks; she was discharged to home on D#2. Pregnancy complicated by nothing. Baby is doing well.  Complains of nothing.   Vaginal bleeding or discharge: No  Mode of feeding infant: Breast Intercourse: No  Contraception: oral contraceptives (estrogen/progesterone) PP depression s/s: No .  Any bowel or bladder issues: No  Pap smear: no abnormalities (date: 06/2019)  Review of Systems: Positive for nothing Her 12 point review of systems is negative or as noted in the History of Present Illness.  Patient Active Problem List   Diagnosis Date Noted  . Labor and delivery, indication for care 10/12/2019  . [redacted] weeks gestation of pregnancy 10/12/2019  . Supervision of low-risk pregnancy 07/03/2019  . Late prenatal care affecting pregnancy 07/03/2019    [facility] had no medications administered during this visit. Current Outpatient Medications  Medication Sig Dispense Refill  . acetaminophen  (TYLENOL) 325 MG tablet Take 2 tablets (650 mg total) by mouth every 4 (four) hours as needed (for pain scale < 4). 30 tablet 0  . docusate sodium (COLACE) 100 MG capsule Take 1 capsule (100 mg total) by mouth every 12 (twelve) hours. 60 capsule 0  . ibuprofen (ADVIL) 600 MG tablet Take 1 tablet (600 mg total) by mouth every 6 (six) hours. 30 tablet 0  . prenatal vitamin w/FE, FA (NATACHEW) 29-1 MG CHEW chewable tablet Chew 1 tablet by mouth daily at 12 noon. 30 tablet 12  . norethindrone (ORTHO MICRONOR) 0.35 MG tablet Take 1 tablet (0.35 mg total) by mouth daily. (Patient not taking: Reported on 11/17/2019) 1 Package 11   No current facility-administered medications for this visit.    Allergies Patient has no known allergies.  Physical Exam:  General:  Alert, oriented and cooperative.   Mental Status: Normal mood and affect perceived. Normal judgment and thought content.  Rest of physical exam deferred due to type of encounter  PP Depression Screening:   Edinburgh Postnatal Depression Scale - 11/17/19 1030      Edinburgh Postnatal Depression Scale:  In the Past 7 Days   I have been able to laugh and see the funny side of things.  0    I have looked forward with enjoyment to things.  0    I have blamed myself unnecessarily when things went wrong.  0    I have been anxious or worried for no good reason.  0    I have felt scared or panicky for no good reason.  0    Things have been getting on top  of me.  0    I have been so unhappy that I have had difficulty sleeping.  0    I have felt sad or miserable.  0    I have been so unhappy that I have been crying.  0    The thought of harming myself has occurred to me.  0    Edinburgh Postnatal Depression Scale Total  0       Assessment:Patient is a 24 y.o. G2P1011 who is 4 weeks postpartum from a normal spontaneous vaginal delivery in the birthing tub.  She is doing well.   Plan: -Patient would like to start Loestrin; explained what to do  if her milk supply is decreased (frequent feeding and skin to skin) -RX sent to pharmacy  RTC PRN  I discussed the assessment and treatment plan with the patient. The patient was provided an opportunity to ask questions and all were answered. The patient agreed with the plan and demonstrated an understanding of the instructions.   The patient was advised to call back or seek an in-person evaluation/go to the ED for any concerning postpartum symptoms.  I provided 10 minutes of non-face-to-face time during this encounter.   Starr Lake, [place] for [employer], [place]

## 2019-11-17 NOTE — Progress Notes (Deleted)
     I connected with@ on 11/17/19 at 10:15 AM EDT by: *** and verified that I am speaking with the correct person using two identifiers.  Patient is located at *** and provider is located at ***.     The purpose of this virtual visit is to provide medical care while limiting exposure to the novel coronavirus. I discussed the limitations, risks, security and privacy concerns of performing an evaluation and management service by *** and the availability of in person appointments. I also discussed with the patient that there may be a patient responsible charge related to this service. By engaging in this virtual visit, you consent to the provision of healthcare.  Additionally, you authorize for your insurance to be billed for the services provided during this visit.  The patient expressed understanding and agreed to proceed.  The following staff members participated in the virtual visit:  ***  Post Partum Visit Note Subjective:   Laurie Woodard is a 24 y.o. G51P1011 female being evaluated for postpartum followup.  She is 5 weeks postpartum following a normal spontaneous vaginal delivery at  38/5 gestational weeks.  I have fully reviewed the prenatal and intrapartum course; pregnancy complicated by ***.  Postpartum course has been ***. Baby is doing well***. Baby is feeding by breast. Bleeding no bleeding. Bowel function is normal. Bladder function is normal. Patient is not sexually active. Contraception method is none. Postpartum depression screening: negative.  {Common ambulatory SmartLinks:19316}  Review of Systems {ros; complete:30496}   Objective:  There were no vitals filed for this visit. Self-Obtained       Assessment:    *** postpartum exam.  Plan:  Essential components of care per ACOG recommendations:  1.  Mood and well being: Patient with {gen negative/positive:315881} depression screening today. Reviewed local resources for support.  - Patient {Action; does/does not:19097} use  tobacco. ***If using tobacco we discussed reduction and for recently cessation risk of relapse - hx of drug use? {yes/no:20286}  *** If yes, discussed support systems  2. Infant care and feeding:  -Patient currently breastmilk feeding? {yes/no:20286} ***If breastmilk feeding discussed return to work and pumping. If needed, patient was provided letter for work to allow for every 2-3 hr pumping breaks, and to be granted a private location to express breastmilk and refrigerated area to store breastmilk. Reviewed importance of draining breast regularly to support lactation. -Social determinants of health (SDOH) reviewed in EPIC. No concerns***The following needs were identified***  3. Sexuality, contraception and birth spacing - Patient {DOES_DOES QQI:29798} want a pregnancy in the next year.  Desired family size is {NUMBER 1-10:22536} children.  - Reviewed forms of contraception in tiered fashion. Patient desired {PLAN CONTRACEPTION:313102} today.   - Discussed birth spacing of 18 months  4. Sleep and fatigue -Encouraged family/partner/community support of 4 hrs of uninterrupted sleep to help with mood and fatigue  5. Physical Recovery  - Discussed patients delivery*** and complications - Patient had a *** degree laceration, perineal healing reviewed. Patient expressed understanding - Patient has urinary incontinence? {yes/no:20286}*** Patient was referred to pelvic floor PT  - Patient {ACTION; IS/IS XQJ:[id number]} safe to resume physical and sexual activity  6.  Health Maintenance - Last pap smear done *** and was {Normal/abnormal wildcard:19619} with negative HPV. ***Mammogram  7. ***Chronic Disease - PCP follow up  *** minutes of non-face-to-face time spent with the patient    Henrietta Dine, [facility] [employer], [facility]

## 2020-01-13 ENCOUNTER — Encounter: Payer: Self-pay | Admitting: Advanced Practice Midwife

## 2020-09-14 DIAGNOSIS — Z20822 Contact with and (suspected) exposure to covid-19: Secondary | ICD-10-CM | POA: Diagnosis not present

## 2021-09-02 DIAGNOSIS — I498 Other specified cardiac arrhythmias: Secondary | ICD-10-CM | POA: Diagnosis not present

## 2021-09-02 DIAGNOSIS — Z20822 Contact with and (suspected) exposure to covid-19: Secondary | ICD-10-CM | POA: Diagnosis not present

## 2021-09-02 DIAGNOSIS — J45901 Unspecified asthma with (acute) exacerbation: Secondary | ICD-10-CM | POA: Diagnosis not present

## 2021-09-02 DIAGNOSIS — R062 Wheezing: Secondary | ICD-10-CM | POA: Diagnosis not present

## 2021-09-12 IMAGING — US US MFM OB COMP +14 WKS
1 of 2 series · 12 of 28 positions shown · non-contrast
Comparison: none

[Series 1: us mfm ob comp +14 wks · 12 of 116 slices shown]
[im 5/116]
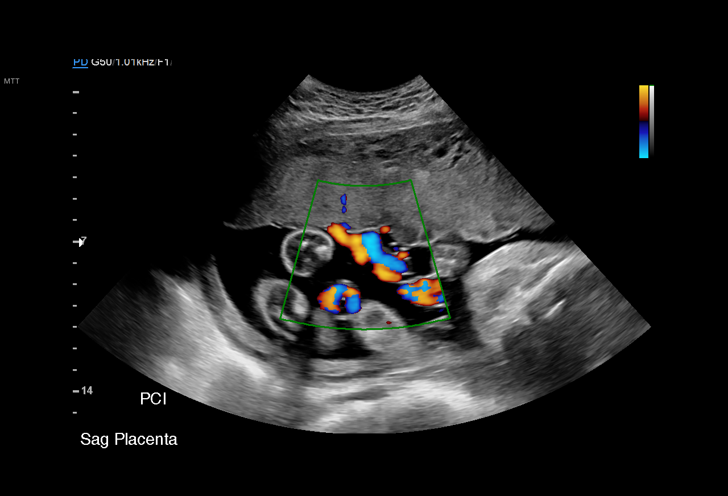
[im 14/116]
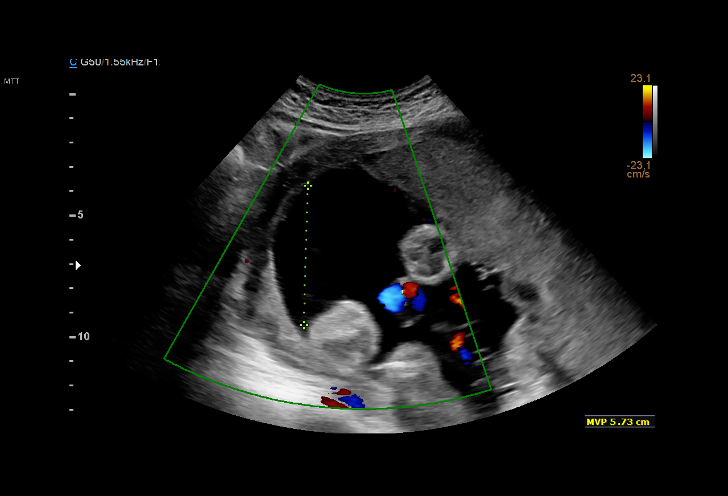
[im 23/116]
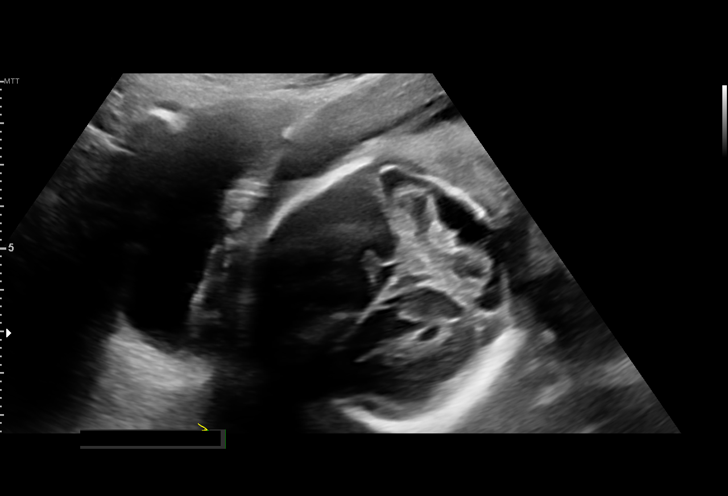
[im 36/116]
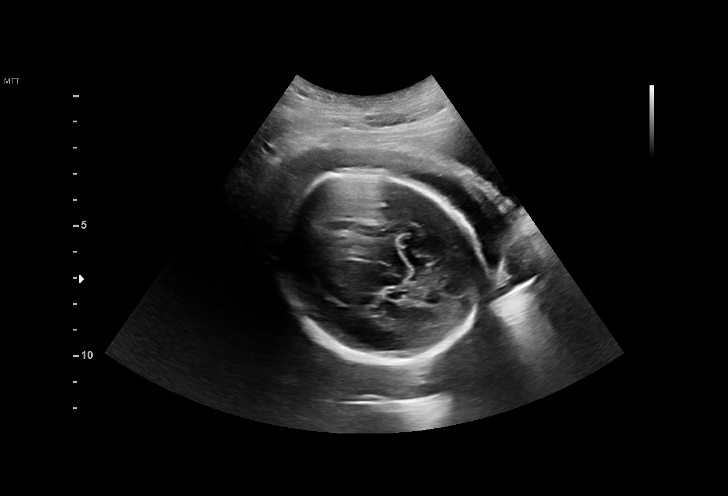
[im 45/116]
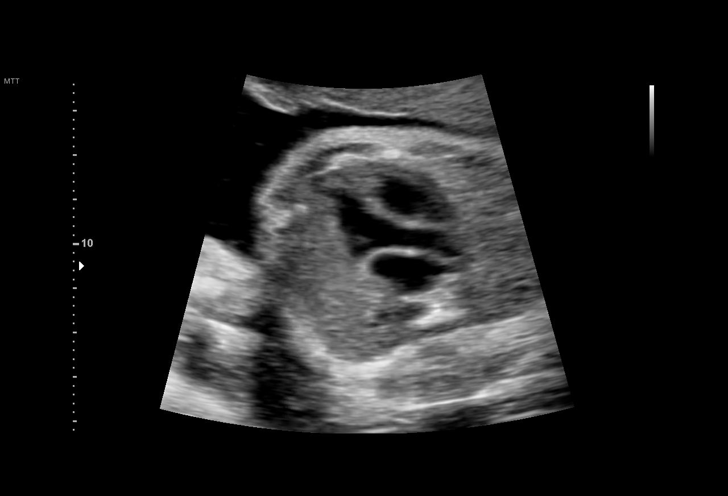
[im 54/116]
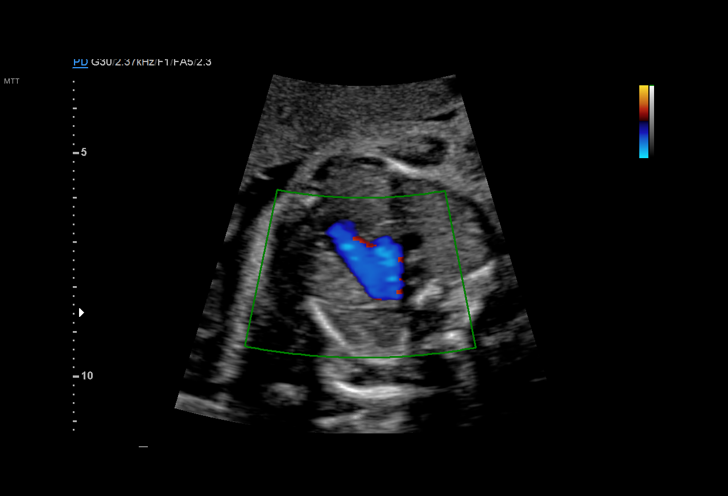
[im 67/116]
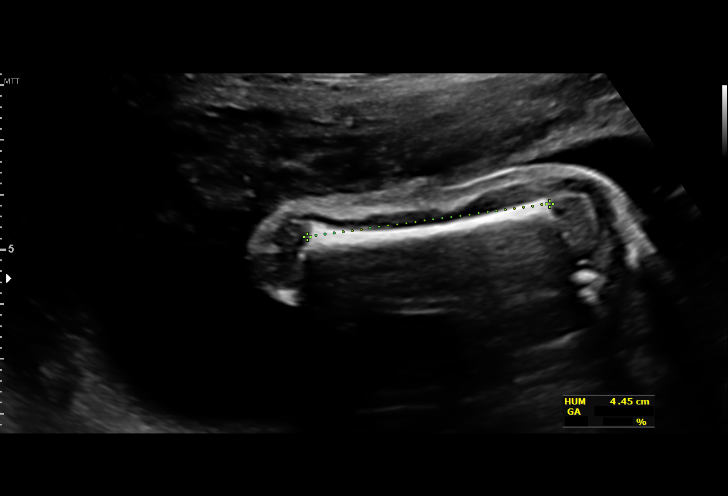
[im 76/116]
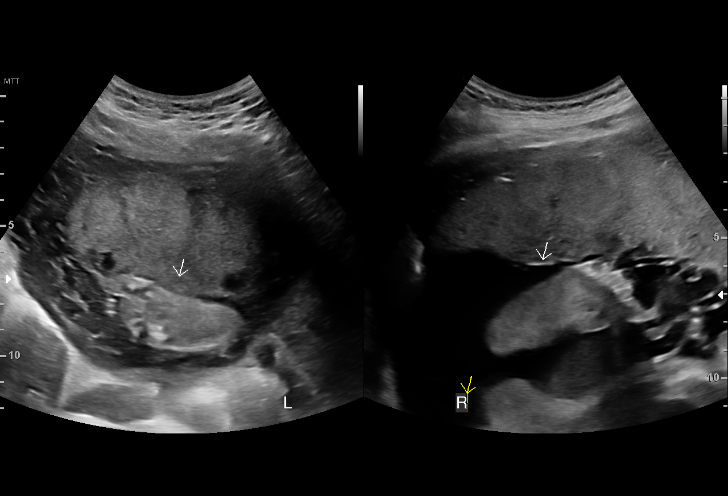
[im 85/116]
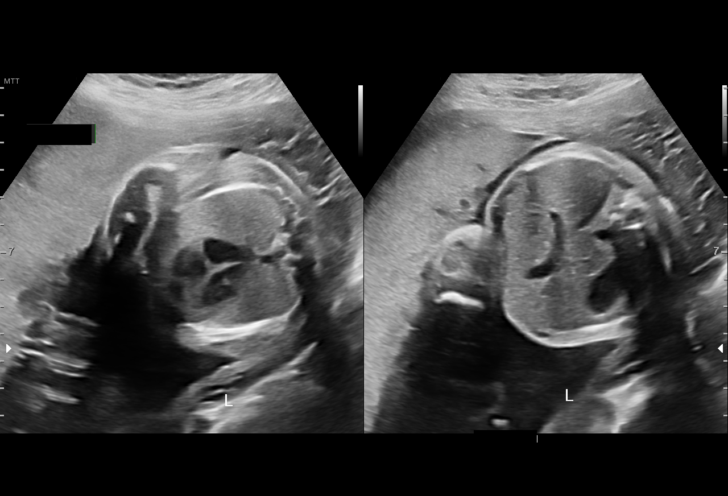
[im 98/116]
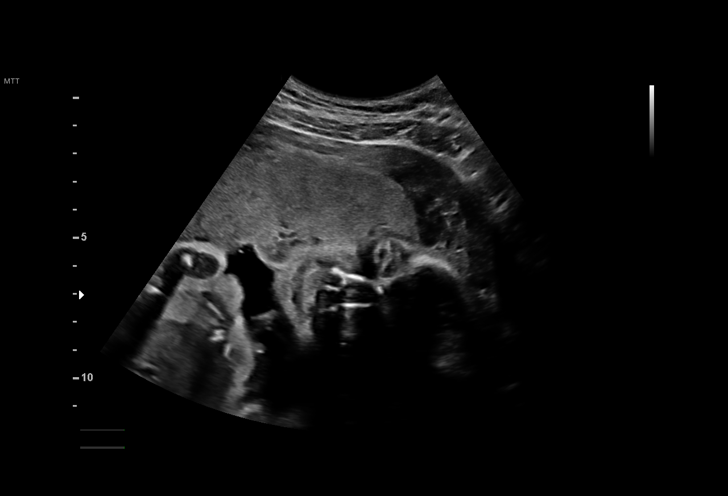
[im 107/116]
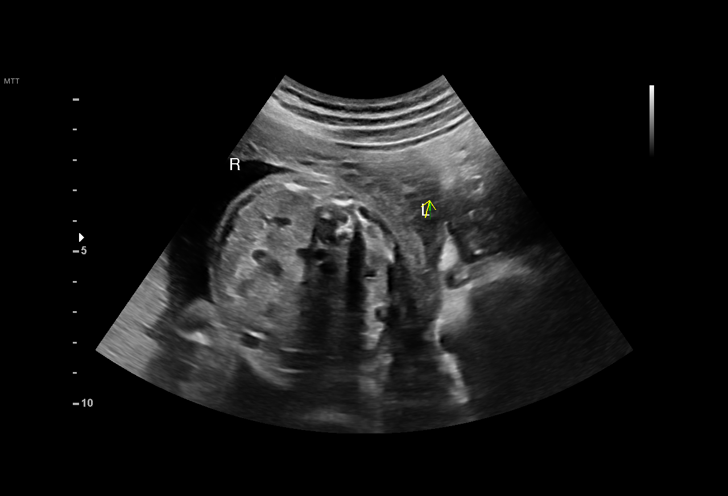
[im 116/116]
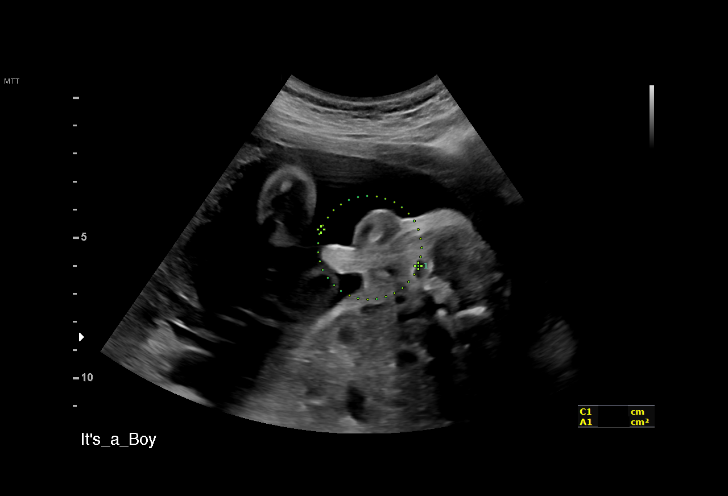

[12 of 28 positions shown; findings below may reference images not displayed]

[address]

 ----------------------------------------------------------------------

 ----------------------------------------------------------------------
Indications

  27 weeks gestation of pregnancy
  Late prenatal care, second trimester
  Encounter for antenatal screening for
  malformations (Low Risk NIPS)
 ----------------------------------------------------------------------
Fetal Evaluation

 Num Of Fetuses:         1
 Fetal Heart Rate(bpm):  138
 Cardiac Activity:       Observed
 Presentation:           Cephalic
 Placenta:               Anterior
 P. Cord Insertion:      Visualized, central

 Amniotic Fluid
 AFI FV:      Within normal limits

                             Largest Pocket(cm)

Biometry

 BPD:      68.3  mm     G. Age:  27w 3d         26  %    CI:        79.68   %    70 - 86
                                                         FL/HC:      20.1   %    18.8 -
 HC:      241.8  mm     G. Age:  26w 2d          1  %    HC/AC:      1.06        1.05 -
 AC:      228.6  mm     G. Age:  27w 2d         24  %    FL/BPD:     71.2   %    71 - 87
 FL:       48.6  mm     G. Age:  26w 2d          5  %    FL/AC:      21.3   %    20 - 24
 HUM:      44.5  mm     G. Age:  26w 3d         14  %
 CER:      35.8  mm     G. Age:  30w 6d       > 95  %
 LV:        3.5  mm
 CM:        8.1  mm
 Est. FW:     990  gm      2 lb 3 oz     10  %
OB History

 Blood Type:    O+
 Gravidity:    2         Term:   0        Prem:   0        SAB:   0
 TOP:          1       Ectopic:  0        Living: 0
Gestational Age

 LMP:           25w 6d        Date:  01/29/19                 EDD:   11/05/19
 U/S Today:     26w 6d                                        EDD:   10/29/19
 Best:          27w 6d     Det. By:  Early Ultrasound         EDD:   10/22/19
                                     (03/28/19)
Anatomy

 Cranium:               Appears normal         LVOT:                   Appears normal
 Cavum:                 Appears normal         Aortic Arch:            Appears normal
 Ventricles:            Appears normal         Ductal Arch:            Appears normal
 Choroid Plexus:        Appears normal         Diaphragm:              Appears normal
 Cerebellum:            Appears normal         Stomach:                Appears normal, left
                                                                       sided
 Posterior Fossa:       Appears normal         Abdomen:                Appears normal
 Nuchal Fold:           Not applicable (>20    Abdominal Wall:         Appears nml (cord
                        wks GA)                                        insert, abd wall)
 Face:                  Appears normal         Cord Vessels:           Appears normal (3
                        (orbits and profile)                           vessel cord)
 Lips:                  Appears normal         Kidneys:                Appear normal
 Palate:                Not well visualized    Bladder:                Appears normal
 Thoracic:              Appears normal         Spine:                  Limited views
                                                                       appear normal
 Heart:                 Appears normal         Upper Extremities:      Appears normal
                        (4CH, axis, and
                        situs)
 RVOT:                  Appears normal         Lower Extremities:      Appears normal

 Other:  Parents do not wish to know sex of fetus at this time- Male gender.
         Heels and Left 5th digit visualized. Nasal bone visualized.
Doppler - Fetal Vessels

 Umbilical Artery
  S/D     %tile     RI              PI                     ADFV    RDFV
 2.95       45   0.66             1.02                        No      No

Cervix Uterus Adnexa

 Cervix
 Not visualized (advanced GA >79wks)

 Uterus
 No abnormality visualized.

 Left Ovary
 Within normal limits. No adnexal mass visualized.

 Right Ovary
 Within normal limits. No adnexal mass visualized.
 Cul De Sac
 No free fluid seen.

 Adnexa
 No abnormality visualized.
Impression

 Late prenatal care.  Her pregnancy is well dated by 10-week
 ultrasound.  She does not have any chronic medical
 conditions including diabetes or hypertension.

 On cell-free fetal DNA screening, the risks of fetal
 aneuploidies are not increased.

 We performed fetal anatomy scan. No makers of
 aneuploidies or fetal structural defects are seen. Fetal
 biometry is consistent with her previously-established dates.
 The estimated fetal weight is at the 10th  percentile.
 Abdominal circumference measurement is at the 24th
 percentile.  Amniotic fluid is normal and good fetal activity is
 seen.  Umbilical artery Doppler showed normal forward
 diastolic flow.  Patient understands the limitations of
 ultrasound in detecting fetal anomalies.
 I reassured the patient of the findings that I did not suspect
 fetal growth restriction now.
Recommendations

 -An appointment was made for her to return in 3 weeks for
 fetal growth assessment.
                 Tasner, Ramshkreli

## 2021-10-03 DIAGNOSIS — R079 Chest pain, unspecified: Secondary | ICD-10-CM | POA: Diagnosis not present

## 2021-10-03 DIAGNOSIS — J45901 Unspecified asthma with (acute) exacerbation: Secondary | ICD-10-CM | POA: Diagnosis not present

## 2021-10-04 DIAGNOSIS — R0602 Shortness of breath: Secondary | ICD-10-CM | POA: Diagnosis not present

## 2021-10-04 DIAGNOSIS — R000 Tachycardia, unspecified: Secondary | ICD-10-CM | POA: Diagnosis not present

## 2021-10-04 DIAGNOSIS — R079 Chest pain, unspecified: Secondary | ICD-10-CM | POA: Diagnosis not present

## 2021-10-24 IMAGING — US US MFM OB FOLLOW-UP
1 series · 13 of 28 positions shown · non-contrast
Comparison: none

[Series 1: us mfm ob follow-up · 13 of 36 slices shown]
[im 2/36]
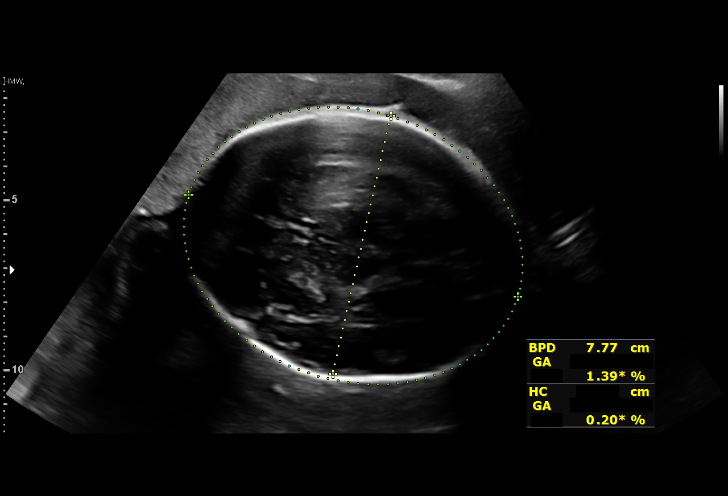
[im 4/36]
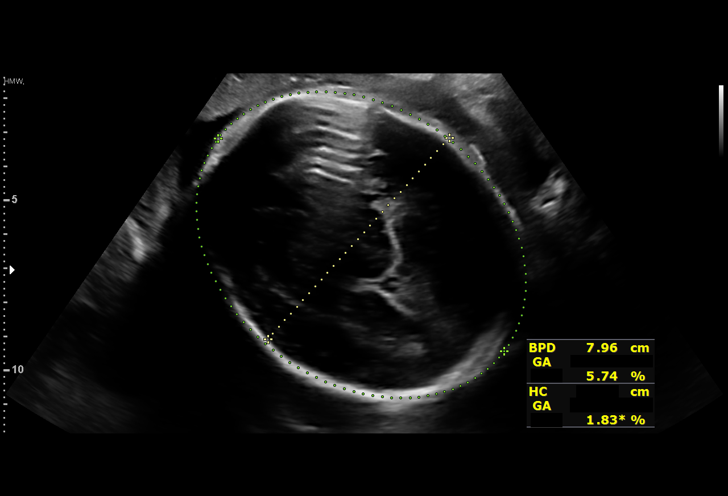
[im 7/36]
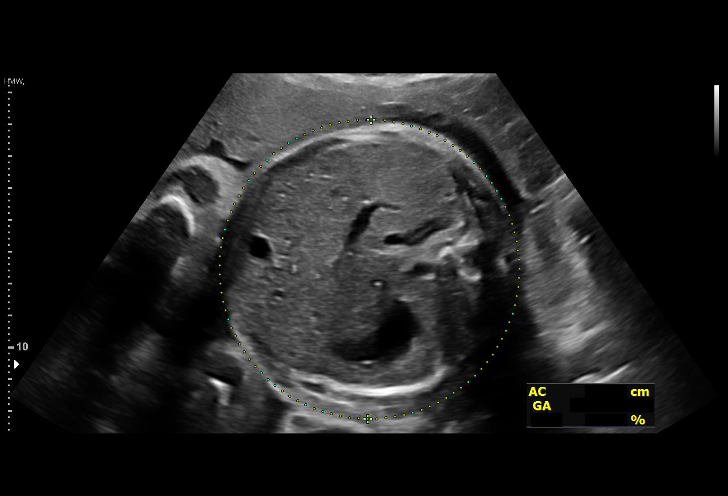
[im 10/36]
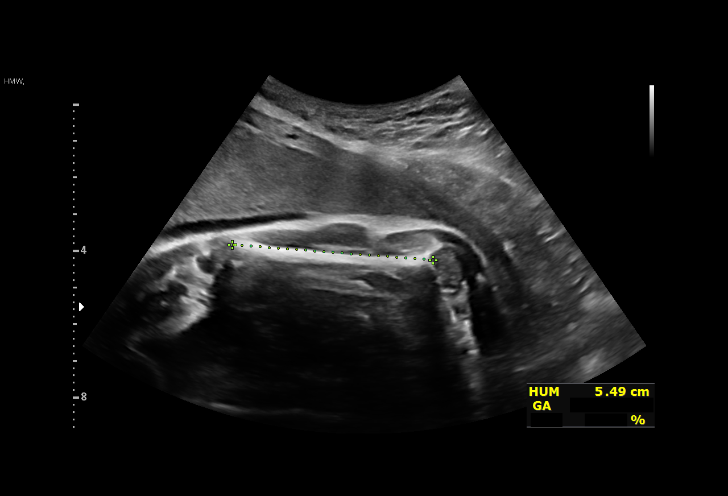
[im 12/36]
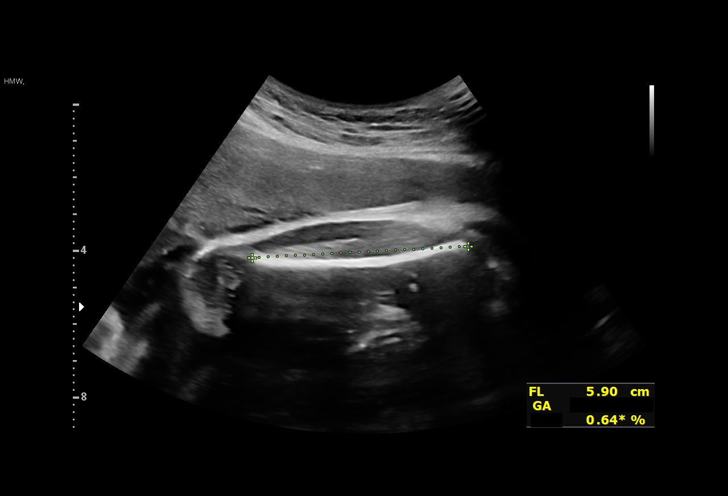
[im 15/36]
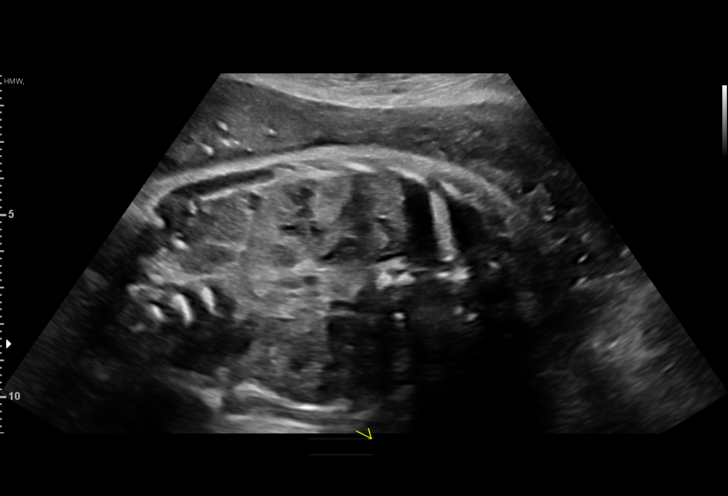
[im 19/36]
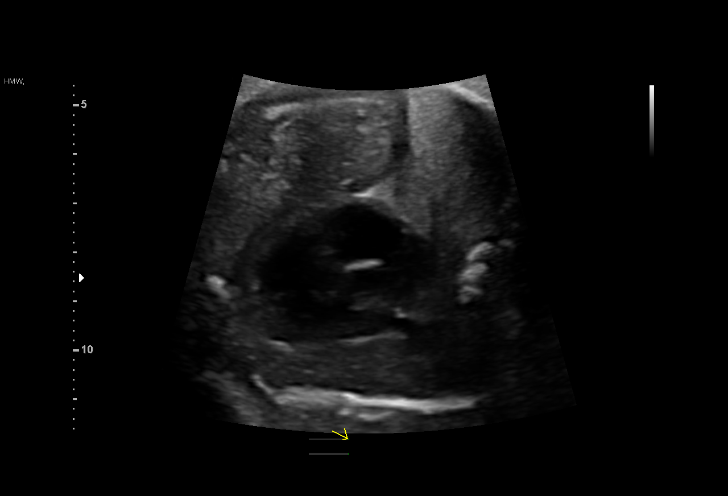
[im 21/36]
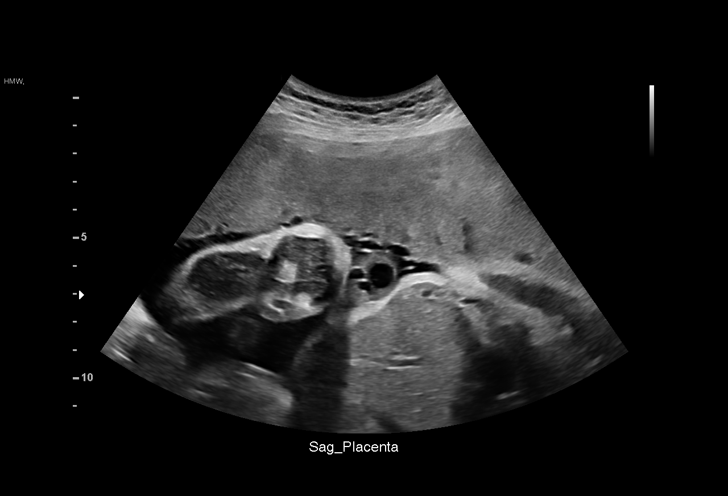
[im 24/36]
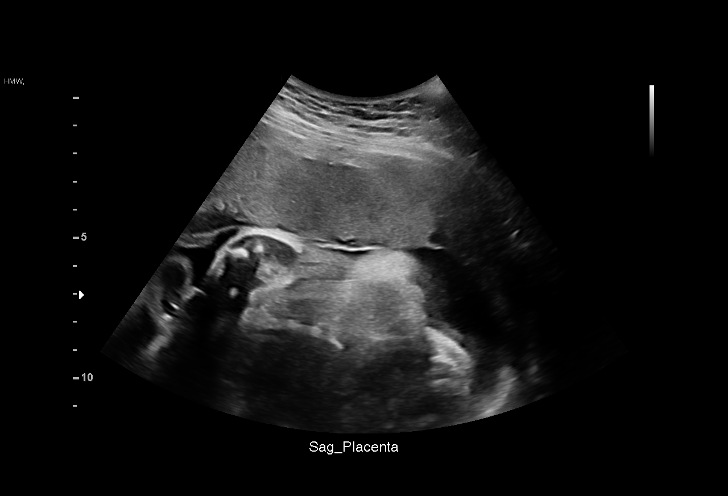
[im 26/36]
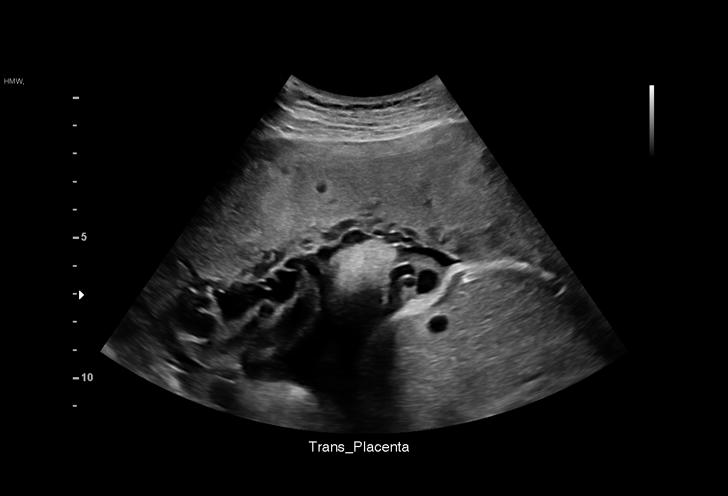
[im 29/36]
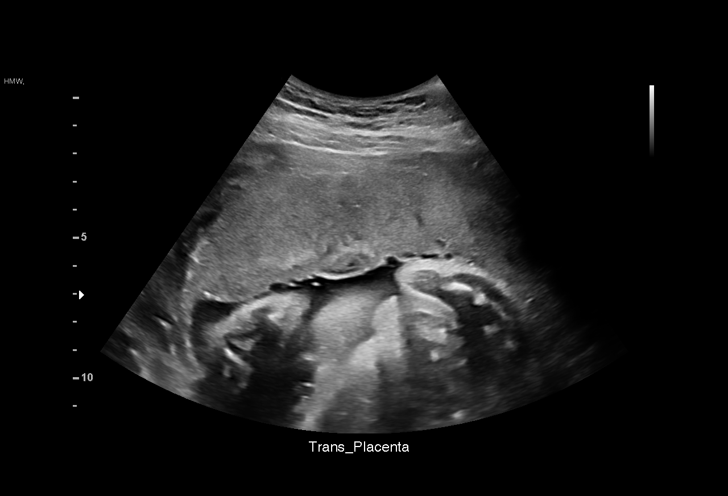
[im 32/36]
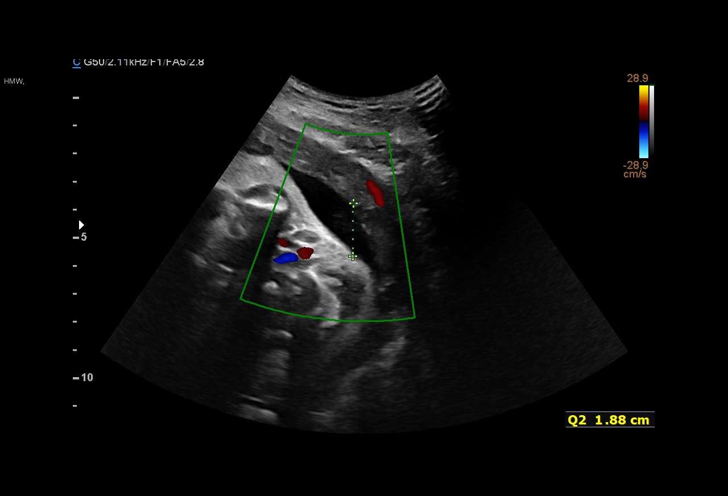
[im 34/36]
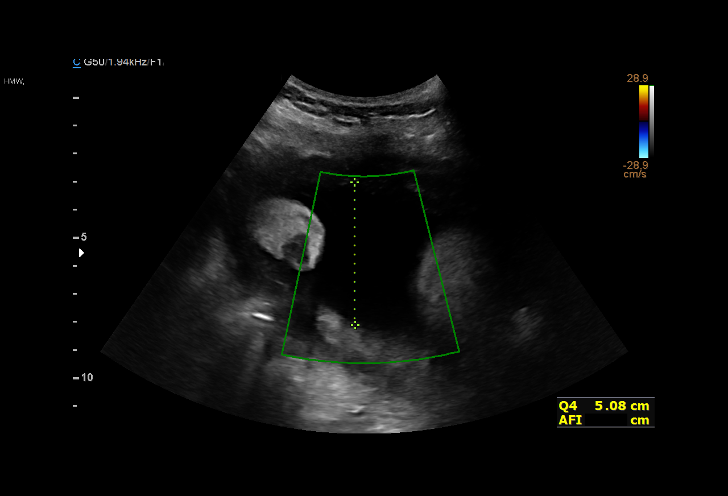

[13 of 28 positions shown; findings below may reference images not displayed]

[address]

 ----------------------------------------------------------------------

 ----------------------------------------------------------------------
Indications

  33 weeks gestation of pregnancy
  Maternal care for known or suspected poor
  fetal growth, third trimester, not applicable or
  unspecified IUGR
  Late to prenatal care, third trimester
  Encounter for other antenatal screening
  follow-up (low risk Nips, neg horizon)
 ----------------------------------------------------------------------
Fetal Evaluation

 Num Of Fetuses:         1
 Fetal Heart Rate(bpm):  119
 Cardiac Activity:       Observed
 Presentation:           Cephalic
 Placenta:               Anterior
 P. Cord Insertion:      Visualized, central

 Amniotic Fluid
 AFI FV:      Within normal limits

 AFI Sum(cm)     %Tile       Largest Pocket(cm)
 16.7            61

 RUQ(cm)       RLQ(cm)       LUQ(cm)        LLQ(cm)

Biometry

 BPD:      78.8  mm     G. Age:  31w 4d          3  %    CI:        75.08   %    70 - 86
                                                         FL/HC:      20.7   %    19.4 -
 HC:      288.5  mm     G. Age:  31w 5d        < 1  %    HC/AC:      0.96        0.96 -
 AC:       299   mm     G. Age:  33w 6d         54  %    FL/BPD:     75.8   %    71 - 87
 FL:       59.7  mm     G. Age:  31w 1d          1  %    FL/AC:      20.0   %    20 - 24
 HUM:      54.8  mm     G. Age:  31w 6d         17  %
 Est. FW:    6366  gm      4 lb 7 oz     14  %
OB History

 Blood Type:    O+
 Gravidity:    2         Term:   0        Prem:   0        SAB:   0
 TOP:          1       Ectopic:  0        Living: 0
Gestational Age

 LMP:           31w 6d        Date:  01/29/19                 EDD:   11/05/19
 U/S Today:     32w 1d                                        EDD:   11/03/19
 Best:          33w 6d     Det. By:  Early Ultrasound         EDD:   10/22/19
                                     (03/28/19)
Anatomy

 Cranium:               Appears normal         LVOT:                   Appears normal
 Cavum:                 Previously seen        Aortic Arch:            Previously seen
 Ventricles:            Appears normal         Ductal Arch:            Previously seen
 Choroid Plexus:        Previously seen        Diaphragm:              Previously seen
 Cerebellum:            Previously seen        Stomach:                Appears normal, left
                                                                       sided
 Posterior Fossa:       Previously seen        Abdomen:                Previously seen
 Nuchal Fold:           Not applicable (>20    Abdominal Wall:         Previously seen
                        wks GA)
 Face:                  Orbits and profile     Cord Vessels:           Previously seen
                        previously seen
 Lips:                  Previously seen        Kidneys:                Appear normal
 Palate:                Not well visualized    Bladder:                Appears normal
 Thoracic:              Appears normal         Spine:                  Limited views
                                                                       previously seen
 Heart:                 Appears normal         Upper Extremities:      Previously seen
                        (4CH, axis, and
                        situs)
 RVOT:                  Appears normal         Lower Extremities:      Previously seen

 Other:  Parents do not wish to know sex of fetus at this time- Male gender.
         Heels and Left 5th digit prev visualized. Nasal bone visualized
         previously.
Doppler - Fetal Vessels

 Umbilical Artery
  S/D     %tile     RI              PI                     ADFV    RDFV
 3.66       94   0.73             1.15                         N       N

Cervix Uterus Adnexa

 Cervix
 Not visualized (advanced GA >33wks)
Impression

 Patient is here for fetal growth assessment because of
 potential fetal growth restriction.
 Amniotic fluid is normal good fetal activity seen.  Estimated
 fetal weight is at the 14th percentile.  Head circumference
 measurement is at between -1 and -2 SD (no evidence of
 microcephaly).
 We reassured the patient of the findings.
Recommendations

 -An appointment has been made for her to return in 3 weeks
 for fetal growth assessment (because of small head
 circumference).
                 Purcell, Andrade

## 2021-10-26 DIAGNOSIS — Z111 Encounter for screening for respiratory tuberculosis: Secondary | ICD-10-CM | POA: Diagnosis not present

## 2021-12-08 DIAGNOSIS — R21 Rash and other nonspecific skin eruption: Secondary | ICD-10-CM | POA: Diagnosis not present

## 2021-12-08 DIAGNOSIS — J4540 Moderate persistent asthma, uncomplicated: Secondary | ICD-10-CM | POA: Diagnosis not present

## 2021-12-08 DIAGNOSIS — Z79899 Other long term (current) drug therapy: Secondary | ICD-10-CM | POA: Diagnosis not present

## 2022-10-21 DIAGNOSIS — J029 Acute pharyngitis, unspecified: Secondary | ICD-10-CM | POA: Diagnosis not present

## 2022-10-21 DIAGNOSIS — B9789 Other viral agents as the cause of diseases classified elsewhere: Secondary | ICD-10-CM | POA: Diagnosis not present

## 2022-10-21 DIAGNOSIS — Z20822 Contact with and (suspected) exposure to covid-19: Secondary | ICD-10-CM | POA: Diagnosis not present

## 2023-04-02 ENCOUNTER — Ambulatory Visit: Payer: Medicaid Other | Admitting: Family Medicine
